# Patient Record
Sex: Female | Born: 1955
Health system: Southern US, Community
[De-identification: ages and names within clinical notes are randomized; demographics above are authoritative.]

## PROBLEM LIST (undated history)

## (undated) DIAGNOSIS — M810 Age-related osteoporosis without current pathological fracture: Secondary | ICD-10-CM

## (undated) DIAGNOSIS — Z950 Presence of cardiac pacemaker: Secondary | ICD-10-CM

## (undated) DIAGNOSIS — I442 Atrioventricular block, complete: Secondary | ICD-10-CM

## (undated) DIAGNOSIS — M818 Other osteoporosis without current pathological fracture: Secondary | ICD-10-CM

## (undated) DIAGNOSIS — M47812 Spondylosis without myelopathy or radiculopathy, cervical region: Secondary | ICD-10-CM

## (undated) DIAGNOSIS — D329 Benign neoplasm of meninges, unspecified: Secondary | ICD-10-CM

## (undated) HISTORY — DX: Other osteoporosis without current pathological fracture: M81.8

## (undated) HISTORY — DX: Presence of cardiac pacemaker: Z95.0

## (undated) HISTORY — DX: Benign neoplasm of meninges, unspecified: D32.9

## (undated) HISTORY — DX: Age-related osteoporosis without current pathological fracture: M81.0

## (undated) HISTORY — DX: Atrioventricular block, complete: I44.2

## (undated) HISTORY — DX: Spondylosis without myelopathy or radiculopathy, cervical region: M47.812

---

## 2014-01-29 ENCOUNTER — Other Ambulatory Visit: Payer: Self-pay | Admitting: Neurology

## 2014-01-29 ENCOUNTER — Encounter: Payer: Self-pay | Admitting: Neurology

## 2014-01-29 DIAGNOSIS — D329 Benign neoplasm of meninges, unspecified: Secondary | ICD-10-CM

## 2014-01-29 HISTORY — DX: Benign neoplasm of meninges, unspecified: D32.9

## 2014-01-29 NOTE — Progress Notes (Unsigned)
I called patient today, followup study for meningioma, last study done at [facility] in 2013, will reorder MRI the brain.

## 2014-02-13 ENCOUNTER — Other Ambulatory Visit: Payer: Self-pay

## 2014-02-26 ENCOUNTER — Telehealth: Payer: Self-pay | Admitting: Neurology

## 2014-02-26 ENCOUNTER — Ambulatory Visit
Admission: RE | Admit: 2014-02-26 | Discharge: 2014-02-26 | Disposition: A | Discharge status: Home / Self Care | Payer: BC Managed Care – PPO | Source: Ambulatory Visit | Attending: Neurology | Admitting: Neurology

## 2014-02-26 DIAGNOSIS — D329 Benign neoplasm of meninges, unspecified: Secondary | ICD-10-CM

## 2014-02-26 DIAGNOSIS — D320 Benign neoplasm of cerebral meninges: Secondary | ICD-10-CM

## 2014-02-26 MED ORDER — GADOBENATE DIMEGLUMINE 529 MG/ML IV SOLN
13.0000 mL | Freq: Once | INTRAVENOUS | Status: AC | PRN
  Administered 2014-02-26: 13 mL via INTRAVENOUS

## 2014-02-26 NOTE — Telephone Encounter (Signed)
I called patient, MRI the brain shows a meningioma, no change in size from December 2013. The line was busy, I will try back later.    MRI brain 02/26/2014:  Impression   Abnormal MRI brain (with and without) demonstrating: 1. There is a left fronto-parietal convexity meningioma (1.4x1.2cm on  axial views). No edema of significant mass effect. 2. No change from MRI on 08/09/12.

## 2014-02-26 NOTE — Telephone Encounter (Signed)
I called the patient again, discussed the MRI results with her. We will recheck this study in about 3 years.

## 2015-12-29 DIAGNOSIS — M79671 Pain in right foot: Secondary | ICD-10-CM | POA: Diagnosis not present

## 2015-12-29 DIAGNOSIS — M21621 Bunionette of right foot: Secondary | ICD-10-CM | POA: Diagnosis not present

## 2016-02-02 DIAGNOSIS — M21621 Bunionette of right foot: Secondary | ICD-10-CM | POA: Diagnosis not present

## 2016-02-19 DIAGNOSIS — H0014 Chalazion left upper eyelid: Secondary | ICD-10-CM | POA: Diagnosis not present

## 2016-03-03 DIAGNOSIS — Z23 Encounter for immunization: Secondary | ICD-10-CM | POA: Diagnosis not present

## 2016-03-03 DIAGNOSIS — Z1389 Encounter for screening for other disorder: Secondary | ICD-10-CM | POA: Diagnosis not present

## 2016-03-03 DIAGNOSIS — Z0000 Encounter for general adult medical examination without abnormal findings: Secondary | ICD-10-CM | POA: Diagnosis not present

## 2016-03-11 DIAGNOSIS — Z780 Asymptomatic menopausal state: Secondary | ICD-10-CM | POA: Diagnosis not present

## 2016-03-16 DIAGNOSIS — Z1231 Encounter for screening mammogram for malignant neoplasm of breast: Secondary | ICD-10-CM | POA: Diagnosis not present

## 2016-06-16 ENCOUNTER — Ambulatory Visit (INDEPENDENT_AMBULATORY_CARE_PROVIDER_SITE_OTHER): Payer: BLUE CROSS/BLUE SHIELD | Admitting: Neurology

## 2016-06-16 ENCOUNTER — Encounter: Payer: Self-pay | Admitting: Neurology

## 2016-06-16 VITALS — BP 104/68 | HR 58 | Ht 64.0 in | Wt 140.0 lb

## 2016-06-16 DIAGNOSIS — F0781 Postconcussional syndrome: Secondary | ICD-10-CM | POA: Diagnosis not present

## 2016-06-16 DIAGNOSIS — D329 Benign neoplasm of meninges, unspecified: Secondary | ICD-10-CM | POA: Diagnosis not present
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
## 2016-06-23 DIAGNOSIS — Z23 Encounter for immunization: Secondary | ICD-10-CM | POA: Diagnosis not present

## 2016-07-05 ENCOUNTER — Telehealth: Payer: Self-pay | Admitting: Neurology

## 2016-07-05 DIAGNOSIS — F0781 Postconcussional syndrome: Secondary | ICD-10-CM

## 2016-07-05 DIAGNOSIS — D329 Benign neoplasm of meninges, unspecified: Secondary | ICD-10-CM

## 2016-07-05 NOTE — Telephone Encounter (Signed)
Patient called regarding MRI scheduling, states she hasn't heard from anyone since seeing Dr. Jannifer Franklin October 26th.

## 2016-07-06 DIAGNOSIS — M1712 Unilateral primary osteoarthritis, left knee: Secondary | ICD-10-CM | POA: Diagnosis not present

## 2016-07-06 NOTE — Telephone Encounter (Signed)
Noted, thank you. I faxed the orders!

## 2016-07-06 NOTE — Telephone Encounter (Signed)
I got the authorization for her MRI, I called the patient back and set up her appointment at [facility] on Monday 07/11/16 at 3 pm. But since she is 63 they informed me that I need to fax a lab order that is BUNCreatine in along with the MRI order. If you don't mind putting that lab order in for me so I can fax them both together.

## 2016-07-11 DIAGNOSIS — D329 Benign neoplasm of meninges, unspecified: Secondary | ICD-10-CM | POA: Diagnosis not present

## 2016-07-11 DIAGNOSIS — R269 Unspecified abnormalities of gait and mobility: Secondary | ICD-10-CM | POA: Diagnosis not present

## 2016-07-11 DIAGNOSIS — F0781 Postconcussional syndrome: Secondary | ICD-10-CM | POA: Diagnosis not present

## 2016-07-12 ENCOUNTER — Telehealth: Payer: Self-pay | Admitting: Neurology

## 2016-07-12 NOTE — Telephone Encounter (Signed)
I called patient. The MRI of the brain showed a 1.5 cm enhancing mass consistent with meningioma along the left operculum. This appears to be stable from a prior study done in 2013. There is no associated cerebral edema. MRI of the brain itself is normal.  I discussed this with the patient.

## 2016-07-27 DIAGNOSIS — H1859 Other hereditary corneal dystrophies: Secondary | ICD-10-CM | POA: Diagnosis not present

## 2016-07-27 DIAGNOSIS — H52203 Unspecified astigmatism, bilateral: Secondary | ICD-10-CM | POA: Diagnosis not present

## 2017-03-17 ENCOUNTER — Encounter (HOSPITAL_COMMUNITY): Payer: Self-pay | Admitting: Emergency Medicine

## 2017-03-17 ENCOUNTER — Emergency Department (HOSPITAL_COMMUNITY): Payer: BLUE CROSS/BLUE SHIELD

## 2017-03-17 ENCOUNTER — Inpatient Hospital Stay (HOSPITAL_COMMUNITY)
Admission: EM | Admit: 2017-03-17 | Discharge: 2017-03-20 | DRG: 244 | Disposition: A | Discharge status: Home / Self Care | Payer: BLUE CROSS/BLUE SHIELD | Attending: Cardiology | Admitting: Cardiology

## 2017-03-17 DIAGNOSIS — Z950 Presence of cardiac pacemaker: Secondary | ICD-10-CM | POA: Diagnosis not present

## 2017-03-17 DIAGNOSIS — Z95810 Presence of automatic (implantable) cardiac defibrillator: Secondary | ICD-10-CM | POA: Diagnosis not present

## 2017-03-17 DIAGNOSIS — I442 Atrioventricular block, complete: Secondary | ICD-10-CM | POA: Diagnosis not present

## 2017-03-17 DIAGNOSIS — I499 Cardiac arrhythmia, unspecified: Secondary | ICD-10-CM | POA: Diagnosis not present

## 2017-03-17 DIAGNOSIS — I441 Atrioventricular block, second degree: Secondary | ICD-10-CM | POA: Diagnosis not present

## 2017-03-17 DIAGNOSIS — M810 Age-related osteoporosis without current pathological fracture: Secondary | ICD-10-CM | POA: Diagnosis not present

## 2017-03-17 DIAGNOSIS — Z880 Allergy status to penicillin: Secondary | ICD-10-CM

## 2017-03-17 DIAGNOSIS — Z86011 Personal history of benign neoplasm of the brain: Secondary | ICD-10-CM

## 2017-03-17 DIAGNOSIS — R001 Bradycardia, unspecified: Secondary | ICD-10-CM | POA: Diagnosis not present

## 2017-03-17 DIAGNOSIS — I4510 Unspecified right bundle-branch block: Secondary | ICD-10-CM | POA: Diagnosis present

## 2017-03-17 DIAGNOSIS — Z87820 Personal history of traumatic brain injury: Secondary | ICD-10-CM | POA: Diagnosis not present

## 2017-03-17 DIAGNOSIS — Z888 Allergy status to other drugs, medicaments and biological substances status: Secondary | ICD-10-CM | POA: Diagnosis not present

## 2017-03-17 DIAGNOSIS — Z959 Presence of cardiac and vascular implant and graft, unspecified: Secondary | ICD-10-CM

## 2017-03-17 DIAGNOSIS — I361 Nonrheumatic tricuspid (valve) insufficiency: Secondary | ICD-10-CM | POA: Diagnosis not present

## 2017-03-17 LAB — CBC WITH DIFFERENTIAL/PLATELET
BASOS ABS: 0 10*3/uL (ref 0.0–0.1)
BASOS PCT: 0 %
EOS ABS: 0.3 10*3/uL (ref 0.0–0.7)
EOS PCT: 3 %
HCT: 38.4 % (ref 36.0–46.0)
Hemoglobin: 13 g/dL (ref 12.0–15.0)
LYMPHS PCT: 42 %
Lymphs Abs: 4.1 10*3/uL — ABNORMAL HIGH (ref 0.7–4.0)
MCH: 29.7 pg (ref 26.0–34.0)
MCHC: 33.9 g/dL (ref 30.0–36.0)
MCV: 87.9 fL (ref 78.0–100.0)
MONO ABS: 0.4 10*3/uL (ref 0.1–1.0)
Monocytes Relative: 4 %
Neutro Abs: 4.9 10*3/uL (ref 1.7–7.7)
Neutrophils Relative %: 51 %
PLATELETS: 264 10*3/uL (ref 150–400)
RBC: 4.37 MIL/uL (ref 3.87–5.11)
RDW: 13.7 % (ref 11.5–15.5)
WBC: 9.7 10*3/uL (ref 4.0–10.5)

## 2017-03-17 LAB — I-STAT CHEM 8, ED
BUN: 18 mg/dL (ref 6–20)
CALCIUM ION: 1.31 mmol/L (ref 1.15–1.40)
Chloride: 107 mmol/L (ref 101–111)
Creatinine, Ser: 0.7 mg/dL (ref 0.44–1.00)
Glucose, Bld: 110 mg/dL — ABNORMAL HIGH (ref 65–99)
HCT: 37 % (ref 36.0–46.0)
HEMOGLOBIN: 12.6 g/dL (ref 12.0–15.0)
Potassium: 4 mmol/L (ref 3.5–5.1)
SODIUM: 142 mmol/L (ref 135–145)
TCO2: 26 mmol/L (ref 0–100)

## 2017-03-17 LAB — I-STAT TROPONIN, ED: TROPONIN I, POC: 0.01 ng/mL (ref 0.00–0.08)

## 2017-03-17 NOTE — ED Triage Notes (Addendum)
BIB Erika Reyes. EMS for bradycardia.  Mistakenly took 2 tabs of an energy supplement (Theacrine, L-Theanine, and caffeine from coffee) last night and was up all night. Took a nap today, about an hour, then woke up; got chest pressure and SOB.  Husband checked blood pressure and they noted her heart rate and blood pressure were low.  Insurance Nurse line advised pt to call 911.

## 2017-03-17 NOTE — ED Provider Notes (Signed)
Erika Reyes DEPT Provider Note   CSN: [id number] Arrival date & time: 03/17/17  2252 By signing my name below, I, Dyke Brackett, attest that this documentation has been prepared under the direction and in the presence of Damico Partin, Fredia Sorrow, MD . Electronically Signed: Dyke Brackett, Scribe. 03/17/2017. 11:36 PM.   History   Chief Complaint Chief Complaint  Patient presents with  . Bradycardia    HPI Erika Reyes is a 61 y.o. female brought in by ambulance who presents to the Emergency Department complaining of constant, severe bradycardia onset tonight at 9 pm. She reports associated fatigue dizziness, chest pressure and shortness of breath. Last night, pt mistakenly took 2 tabs of energy supplement EDGE-- active ingredients are Theacrine, L-Theanine, and caffeine-- and was unable to sleep last night. After waking up from her nap this evening, she began experiencing shortness of breath and chest pressure. Her husband checked her blood pressure which was 97/43 and pt's heart rate was 31. Other than Alondronate, the patient is currently on no regular medications. No recent travel to the [place]. No known recent tick bites.   The history is provided by the patient. No language interpreter was used.   Past Medical History:  Diagnosis Date  . Meningioma ([place]) 01/29/2014  . Osteoporosis of lumbar spine     Patient Active Problem List   Diagnosis Date Noted  . Post concussive encephalopathy 06/16/2016  . Meningioma ([place]) 01/29/2014    History reviewed. No pertinent surgical history.  OB History    No data available       Home Medications    Prior to Admission medications   Medication Sig Start Date End Date Taking? Authorizing Provider  alendronate (FOSAMAX) 35 MG tablet  06/09/16   [provider]  Multiple Vitamin (MULTI-VITAMINS) TABS Take by mouth.    [provider]  Omega 3-6-9 Fatty Acids (OMEGA-3-6-9 PO) Take 2 capsules by mouth daily.     [provider]    Family History Family History  Problem Relation Age of Onset  . Cancer Mother   . Dementia Mother   . Other Father        Cervical stenosis    Social History Social History  Substance Use Topics  . Smoking status: Never Smoker  . Smokeless tobacco: Never Used  . Alcohol use No     Allergies   Celecoxib and Penicillin g   Review of Systems Review of Systems  Constitutional: Positive for fatigue.  Respiratory: Positive for chest tightness and shortness of breath.   Cardiovascular:       +bradycardia  Skin: Negative for wound.  Neurological: Positive for dizziness.  All other systems reviewed and are negative.  Physical Exam Updated Vital Signs BP 132/60 (BP Location: Right Arm)   Pulse (!) 31   Temp 98.2 F (36.8 C) (Oral)   Resp 18   Ht 5\' 4"  (1.626 m)   Wt 140 lb (63.5 kg)   SpO2 98%   BMI 24.03 kg/m   Physical Exam  Constitutional: She is oriented to person, place, and time. She appears well-developed and well-nourished. No distress.  HENT:  Head: Normocephalic and atraumatic.  Eyes: Conjunctivae and EOM are normal.  Neck: Normal range of motion.  Cardiovascular: Normal rate, regular rhythm and normal heart sounds.   Pulmonary/Chest: Effort normal and breath sounds normal.  Abdominal: Soft. She exhibits no distension. There is no tenderness.  Musculoskeletal: Normal range of motion.  Neurological: She is alert and oriented to person,  place, and time.  Skin: Skin is warm and dry.  Psychiatric: She has a normal mood and affect. Judgment normal.  Nursing note and vitals reviewed.  ED Treatments / Results  DIAGNOSTIC STUDIES:  Oxygen Saturation is 98% on RA, normal by my interpretation.    COORDINATION OF CARE:  11:30 PM Will consult cardiology. Discussed treatment plan with pt at bedside and pt agreed to plan.   Labs (all labs ordered are listed, but only abnormal results are displayed) Labs Reviewed  CBC WITH  DIFFERENTIAL/PLATELET  COMPREHENSIVE METABOLIC PANEL  MAGNESIUM  BRAIN NATRIURETIC PEPTIDE  I-STAT CHEM 8, ED  I-STAT TROPONIN, ED    EKG  EKG Interpretation None       Radiology No results found.  Procedures Procedures (including critical care time)  Medications Ordered in ED Medications - No data to display   Initial Impression / Assessment and Plan / ED Course  I have reviewed the triage vital signs and the nursing notes.  Pertinent labs & imaging results that were available during my care of the patient were reviewed by me and considered in my medical decision making (see chart for details).    I personally performed the services described in this documentation, which was scribed in my presence. The recorded information has been reviewed and is accurate.   Patient is a 61 year old female presenting with low heart rate. Patient was feeling fatigued, dizzy today. Patient's husband took her vital signs at home found her to be bradycardic and called EMS.   Patient's only medication is Alondronate. She took 2 "natural caffeine pills" yesterday but she is worried because her symptoms.I think it likely unrelated.  She's not had any cardiac history. Had mild chest heaviness associated with the dizziness this evening. [place] sent (though no recent travel)   Patient is on pads. [facility] consult cardiology.  12:20 AM Discsused with cards. Will admit to ICU   CRITICAL CARE Performed by: Gardiner Sleeper Total critical care time: 60  minutes Critical care time was exclusive of separately billable procedures and treating other patients. Critical care was necessary to treat or prevent imminent or life-threatening deterioration. Critical care was time spent personally by me on the following activities: development of treatment plan with patient and/or surrogate as well as nursing, discussions with consultants, evaluation of patient's response to treatment, examination of patient,  obtaining history from patient or surrogate, ordering and performing treatments and interventions, ordering and review of laboratory studies, ordering and review of radiographic studies, pulse oximetry and re-evaluation of patient's condition.    Final Clinical Impressions(s) / ED Diagnoses   Final diagnoses:  None    New Prescriptions New Prescriptions   No medications on file     Macarthur Critchley, MD 03/18/17 [phone]

## 2017-03-18 ENCOUNTER — Inpatient Hospital Stay (HOSPITAL_COMMUNITY): Payer: BLUE CROSS/BLUE SHIELD

## 2017-03-18 ENCOUNTER — Encounter (HOSPITAL_COMMUNITY): Payer: Self-pay | Admitting: *Deleted

## 2017-03-18 DIAGNOSIS — Z87820 Personal history of traumatic brain injury: Secondary | ICD-10-CM | POA: Diagnosis not present

## 2017-03-18 DIAGNOSIS — I4510 Unspecified right bundle-branch block: Secondary | ICD-10-CM | POA: Diagnosis present

## 2017-03-18 DIAGNOSIS — I361 Nonrheumatic tricuspid (valve) insufficiency: Secondary | ICD-10-CM

## 2017-03-18 DIAGNOSIS — I441 Atrioventricular block, second degree: Principal | ICD-10-CM

## 2017-03-18 DIAGNOSIS — Z950 Presence of cardiac pacemaker: Secondary | ICD-10-CM | POA: Diagnosis not present

## 2017-03-18 DIAGNOSIS — I442 Atrioventricular block, complete: Secondary | ICD-10-CM | POA: Diagnosis not present

## 2017-03-18 DIAGNOSIS — I4430 Unspecified atrioventricular block: Secondary | ICD-10-CM

## 2017-03-18 DIAGNOSIS — M810 Age-related osteoporosis without current pathological fracture: Secondary | ICD-10-CM | POA: Diagnosis present

## 2017-03-18 DIAGNOSIS — Z880 Allergy status to penicillin: Secondary | ICD-10-CM | POA: Diagnosis not present

## 2017-03-18 DIAGNOSIS — Z86011 Personal history of benign neoplasm of the brain: Secondary | ICD-10-CM | POA: Diagnosis not present

## 2017-03-18 DIAGNOSIS — Z888 Allergy status to other drugs, medicaments and biological substances status: Secondary | ICD-10-CM | POA: Diagnosis not present

## 2017-03-18 LAB — ECHOCARDIOGRAM COMPLETE
AVLVOTPG: 8 mmHg
EERAT: 7.38
EWDT: 211 ms
FS: 36 % (ref 28–44)
Height: 64 in
IVS/LV PW RATIO, ED: 0.85
LA ID, A-P, ES: 39 mm
LA diam end sys: 39 mm
LA vol A4C: 52.6 ml
LA vol index: 32.6 mL/m2
LADIAMINDEX: 2.23 cm/m2
LAVOL: 57.1 mL
LV E/e' medial: 7.38
LV PW d: 8.89 mm — AB (ref 0.6–1.1)
LV e' LATERAL: 14.9 cm/s
LVEEAVG: 7.38
LVOT SV: 82 mL
LVOT VTI: 36 cm
LVOT area: 2.27 cm2
LVOT diameter: 17 mm
LVOT peak vel: 140 cm/s
MV Dec: 211
MV pk E vel: 110 m/s
MVPG: 5 mmHg
MVPKAVEL: 83.9 m/s
RV LATERAL S' VELOCITY: 14.3 cm/s
RV TAPSE: 26.4 mm
RV sys press: 39 mmHg
Reg peak vel: 247 cm/s
TDI e' lateral: 14.9
TDI e' medial: 12.9
TRMAXVEL: 247 cm/s
Weight: 2455.04 oz

## 2017-03-18 LAB — COMPREHENSIVE METABOLIC PANEL
ALBUMIN: 3.8 g/dL (ref 3.5–5.0)
ALT: 17 U/L (ref 14–54)
ANION GAP: 8 (ref 5–15)
AST: 31 U/L (ref 15–41)
Alkaline Phosphatase: 65 U/L (ref 38–126)
BUN: 13 mg/dL (ref 6–20)
CO2: 25 mmol/L (ref 22–32)
Calcium: 10 mg/dL (ref 8.9–10.3)
Chloride: 106 mmol/L (ref 101–111)
Creatinine, Ser: 0.74 mg/dL (ref 0.44–1.00)
GFR calc Af Amer: >60 mL/min (ref >60)
GFR calc non Af Amer: >60 mL/min (ref >60)
GLUCOSE: 110 mg/dL — AB (ref 65–99)
POTASSIUM: 3.9 mmol/L (ref 3.5–5.1)
SODIUM: 139 mmol/L (ref 135–145)
Total Bilirubin: 0.6 mg/dL (ref 0.3–1.2)
Total Protein: 6.4 g/dL — ABNORMAL LOW (ref 6.5–8.1)

## 2017-03-18 LAB — MAGNESIUM: Magnesium: 2.3 mg/dL (ref 1.7–2.4)

## 2017-03-18 LAB — BRAIN NATRIURETIC PEPTIDE: B Natriuretic Peptide: 314.2 pg/mL — ABNORMAL HIGH (ref 0.0–100.0)

## 2017-03-18 LAB — MRSA PCR SCREENING: MRSA by PCR: NEGATIVE

## 2017-03-18 MED ORDER — ENOXAPARIN SODIUM 40 MG/0.4ML ~~LOC~~ SOLN: 40.0000 mg | SUBCUTANEOUS | Status: DC

## 2017-03-18 MED ORDER — SODIUM CHLORIDE 0.9 % IV SOLN: 250.0000 mL | INTRAVENOUS | Status: DC | PRN

## 2017-03-18 MED ORDER — VANCOMYCIN HCL IN DEXTROSE 1-5 GM/200ML-% IV SOLN
1000.0000 mg | INTRAVENOUS | Status: AC
  Administered 2017-03-19: 1000 mg via INTRAVENOUS

## 2017-03-18 MED ORDER — ALENDRONATE SODIUM 35 MG PO TABS: 35.0000 mg | ORAL_TABLET | ORAL | Status: DC

## 2017-03-18 MED ORDER — ENOXAPARIN SODIUM 40 MG/0.4ML ~~LOC~~ SOLN
40.0000 mg | SUBCUTANEOUS | Status: DC
  Administered 2017-03-18: 40 mg via SUBCUTANEOUS
  Filled 2017-03-18: qty 0.4

## 2017-03-18 MED ORDER — SODIUM CHLORIDE 0.9 % IR SOLN
80.0000 mg | Status: AC
  Administered 2017-03-19: 80 mg
  Filled 2017-03-18: qty 2

## 2017-03-18 MED ORDER — CHLORHEXIDINE GLUCONATE 4 % EX LIQD
CUTANEOUS | Status: AC
  Administered 2017-03-18: 22:00:00
  Filled 2017-03-18: qty 15

## 2017-03-18 MED ORDER — YOU HAVE A PACEMAKER BOOK
Freq: Once | Status: AC
  Administered 2017-03-18: 21:00:00
  Filled 2017-03-18: qty 1

## 2017-03-18 MED ORDER — SODIUM CHLORIDE 0.9 % IV SOLN
INTRAVENOUS | Status: DC
  Administered 2017-03-19: 06:00:00 via INTRAVENOUS

## 2017-03-18 MED ORDER — SODIUM CHLORIDE 0.45 % IV SOLN
INTRAVENOUS | Status: DC
  Administered 2017-03-19: 06:00:00 via INTRAVENOUS

## 2017-03-18 MED ORDER — ATROPINE SULFATE 1 MG/10ML IJ SOSY
1.0000 mg | PREFILLED_SYRINGE | INTRAMUSCULAR | Status: DC | PRN
  Administered 2017-03-18: 1 mg via INTRAVENOUS
  Filled 2017-03-18 (×2): qty 10

## 2017-03-18 MED ORDER — EPINEPHRINE PF 1 MG/10ML IJ SOSY
PREFILLED_SYRINGE | INTRAMUSCULAR | Status: AC
  Filled 2017-03-18: qty 10

## 2017-03-18 NOTE — Progress Notes (Signed)
Patient is having more frequent drops in HR 23-24. Her BP 101/50s. She denies SOB, dizziness, N/V, skin is warm and dry. She complains of a "slight HA", otherwise she states she feels okay. Atropine given x1- HR up to 33 and BP 121/51. Dr. Emilio Aspen was notified via [facility] page. No response call at this time.

## 2017-03-18 NOTE — Progress Notes (Signed)
PHARMACIST - PHYSICIAN COMMUNICATION  CONCERNING: P&T Medication Policy Regarding Oral Bisphosphonates  RECOMMENDATION: Your order for alendronate (Fosamax), ibandronate (Boniva), or risedronate (Actonel) has been discontinued at this time.  If the patient's post-hospital medical condition warrants safe use of this class of drugs, please resume the pre-hospital regimen upon discharge.  DESCRIPTION:  Alendronate (Fosamax), ibandronate (Boniva), and risedronate (Actonel) can cause severe esophageal erosions in patients who are unable to remain upright at least 30 minutes after taking this medication.   Since brief interruptions in therapy are thought to have minimal impact on bone mineral density, the [place] has established that bisphosphonate orders should be routinely discontinued during hospitalization.   To override this safety policy and permit administration of Boniva, Fosamax, or Actonel in the hospital, prescribers must write "DO NOT HOLD" in the comments section when placing the order for this class of medications.   Wynona Neat, PharmD, BCPS 03/18/2017 1:13 AM

## 2017-03-18 NOTE — Progress Notes (Signed)
Dr. Emilio Aspen returned call. No new orders at this time. Will continue to monitor patient.
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
## 2017-03-18 NOTE — Consult Note (Signed)
ELECTROPHYSIOLOGY CONSULT NOTE    Primary Care Physician: Ronita Hipps, MD Referring Physician:  Dr Domenic Polite  Admit Date: 03/17/2017  Reason for consultation:  AV block  Erika Reyes is a 61 y.o. female with a h/o meningioma but otherwise very healthy who is now admitted with symptomatic AV block.  She reports being very active typically without limitation.  Yesterday, she noticed abrupt fatigue and SOB.  Her symptoms persisted.  She has had minor dizziness without syncope.  She has been found to have advanced AV block.  She is admitted to [facility]. Denies any recent medicine administrations.  Denies recent fevers, chills, or infections.  Today, she denies symptoms of palpitations, chest pain,   orthopnea, PND, lower extremity edema,   syncope, or neurologic sequela. The patient is tolerating medications without difficulties and is otherwise without complaint today.   Past Medical History:  Diagnosis Date  . Meningioma ([place]) 01/29/2014  . Osteoporosis of lumbar spine    History reviewed. No pertinent surgical history.  . enoxaparin (LOVENOX) injection  40 mg Subcutaneous Q24H   . sodium chloride      Allergies  Allergen Reactions  . Celecoxib Hives    Possible allergy, patient was also taking PCN at time reaction occurred.  Marland Kitchen Penicillin G Hives    Social History   Social History  . Marital status: Married    Spouse name: N/A  . Number of children: 2  . Years of education: 3 yrs college   Occupational History  . [employer]    Social History Main Topics  . Smoking status: Never Smoker  . Smokeless tobacco: Never Used  . Alcohol use No  . Drug use: No  . Sexual activity: Yes    Partners: Male    Birth control/ protection: None     Comment: Married   Other Topics Concern  . Not on file   Social History Narrative   Lives at home w/ her husband   Right-handed   Caffeine: rare   Owns a restaurant    Family History  Problem Relation Age of Onset  . Cancer  Mother   . Dementia Mother   . Other Father        Cervical stenosis    ROS- All systems are reviewed and negative except as per the HPI above  Physical Exam: Telemetry: Vitals:   03/18/17 0500 03/18/17 0530 03/18/17 0600 03/18/17 0800  BP: (!) 107/51 101/74 (!) 121/51   Pulse:      Resp: 10 10 15    Temp:    98.7 F (37.1 C)  TempSrc:    Oral  SpO2:      Weight:      Height:        GEN- The patient is well appearing, alert and oriented x 3 today.   Head- normocephalic, atraumatic Eyes-  Sclera clear, conjunctiva pink Ears- hearing intact Oropharynx- clear Neck- supple, no JVP Lymph- no cervical lymphadenopathy Lungs- Clear to ausculation bilaterally, normal work of breathing Heart- bradycardic regular rhythm, no murmurs, rubs or gallops, PMI not laterally displaced GI- soft, NT, ND, + BS Extremities- no clubbing, cyanosis, or edema MS- no significant deformity or atrophy Skin- no rash or lesion Psych- euthymic mood, full affect Neuro- strength and sensation are intact  EKG-  Sinus rhythm with complete heart block with RBBB  Labs:   Lab Results  Component Value Date   WBC 9.7 03/17/2017   HGB 12.6 03/17/2017   HCT 37.0 03/17/2017  MCV 87.9 03/17/2017   PLT 264 03/17/2017    Recent Labs Lab 03/17/17 2318 03/17/17 2342  NA 139 142  K 3.9 4.0  CL 106 107  CO2 25  --   BUN 13 18  CREATININE 0.74 0.70  CALCIUM 10.0  --   PROT 6.4*  --   BILITOT 0.6  --   ALKPHOS 65  --   ALT 17  --   AST 31  --   GLUCOSE 110* 110*     Radiology: no airspace disease  Echo:  pending  ASSESSMENT AND PLAN:   1. Mobitz II second degree AV block with transient complete heart block The patient is admitted with symptomatic AV block.  She is primarily 2:1 AV block but does have more advanced AV block at times.  No reversible causes have been found.  I would therefore recommend pacemaker implantation at this time.  Risks, benefits, alternatives to pacemaker implantation  were discussed in detail with the patient today. The patient understands that the risks include but are not limited to bleeding, infection, pneumothorax, perforation, tamponade, vascular damage, renal failure, MI, stroke, death,  and lead dislodgement and wishes to proceed. We will therefore schedule the procedure at the next available time. I will contact oncall supervisor to see if we can proceed with PPM this weekend.  Would keep Zoll pads on the patient in the interim. Echo tech will assist with echo today prior to PPM implant.  Pt is currently quite tenuous.  She is at risk for acute decompensation.  A high level of decision making was required for this encounter.  Thompson Grayer, MD 03/18/2017  11:20 AM
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
## 2017-03-18 NOTE — Progress Notes (Signed)
  Echocardiogram 2D Echocardiogram has been performed.  Erika Reyes 03/18/2017, 12:30 PM

## 2017-03-19 ENCOUNTER — Encounter (HOSPITAL_COMMUNITY)
Admission: EM | Disposition: A | Discharge status: Home / Self Care | Payer: Self-pay | Source: Home / Self Care | Attending: Cardiology

## 2017-03-19 HISTORY — PX: PACEMAKER IMPLANT: EP1218

## 2017-03-19 LAB — BASIC METABOLIC PANEL
ANION GAP: 5 (ref 5–15)
BUN: 18 mg/dL (ref 6–20)
CHLORIDE: 111 mmol/L (ref 101–111)
CO2: 25 mmol/L (ref 22–32)
Calcium: 9.6 mg/dL (ref 8.9–10.3)
Creatinine, Ser: 0.68 mg/dL (ref 0.44–1.00)
GFR calc Af Amer: >60 mL/min (ref >60)
GLUCOSE: 93 mg/dL (ref 65–99)
POTASSIUM: 4.1 mmol/L (ref 3.5–5.1)
Sodium: 141 mmol/L (ref 135–145)

## 2017-03-19 LAB — HIV ANTIBODY (ROUTINE TESTING W REFLEX): HIV Screen 4th Generation wRfx: NONREACTIVE

## 2017-03-19 LAB — SURGICAL PCR SCREEN
MRSA, PCR: NEGATIVE
Staphylococcus aureus: NEGATIVE

## 2017-03-19 LAB — FERRITIN: FERRITIN: 51 ng/mL (ref 11–307)

## 2017-03-19 SURGERY — PACEMAKER IMPLANT
Anesthesia: LOCAL

## 2017-03-19 MED ORDER — FENTANYL CITRATE (PF) 100 MCG/2ML IJ SOLN
INTRAMUSCULAR | Status: AC
  Filled 2017-03-19: qty 2

## 2017-03-19 MED ORDER — SODIUM CHLORIDE 0.9 % IR SOLN
Status: AC
  Filled 2017-03-19: qty 2

## 2017-03-19 MED ORDER — VANCOMYCIN HCL IN DEXTROSE 1-5 GM/200ML-% IV SOLN
1000.0000 mg | Freq: Two times a day (BID) | INTRAVENOUS | Status: AC
  Administered 2017-03-19: 1000 mg via INTRAVENOUS
  Filled 2017-03-19: qty 200

## 2017-03-19 MED ORDER — ONDANSETRON HCL 4 MG/2ML IJ SOLN
4.0000 mg | Freq: Four times a day (QID) | INTRAMUSCULAR | Status: DC | PRN
Start: 2017-03-19 — End: 2017-03-20

## 2017-03-19 MED ORDER — HYDROCODONE-ACETAMINOPHEN 5-325 MG PO TABS
1.0000 | ORAL_TABLET | ORAL | Status: DC | PRN
  Administered 2017-03-19: 2 via ORAL
  Administered 2017-03-19 – 2017-03-20 (×2): 1 via ORAL
  Filled 2017-03-19 (×2): qty 1
  Filled 2017-03-19: qty 2

## 2017-03-19 MED ORDER — HEPARIN (PORCINE) IN NACL 2-0.9 UNIT/ML-% IJ SOLN
INTRAMUSCULAR | Status: AC
  Filled 2017-03-19: qty 500

## 2017-03-19 MED ORDER — IOPAMIDOL (ISOVUE-370) INJECTION 76%
INTRAVENOUS | Status: DC | PRN
  Administered 2017-03-19: 15 mL via INTRAVENOUS

## 2017-03-19 MED ORDER — ACETAMINOPHEN 325 MG PO TABS: 325.0000 mg | ORAL_TABLET | ORAL | Status: DC | PRN

## 2017-03-19 MED ORDER — IOPAMIDOL (ISOVUE-370) INJECTION 76%
INTRAVENOUS | Status: AC
  Filled 2017-03-19: qty 50

## 2017-03-19 MED ORDER — SODIUM CHLORIDE 0.9% FLUSH
3.0000 mL | Freq: Two times a day (BID) | INTRAVENOUS | Status: DC
  Administered 2017-03-19: 3 mL via INTRAVENOUS

## 2017-03-19 MED ORDER — SODIUM CHLORIDE 0.9% FLUSH: 3.0000 mL | INTRAVENOUS | Status: DC | PRN

## 2017-03-19 MED ORDER — SODIUM CHLORIDE 0.9 % IV SOLN: 250.0000 mL | INTRAVENOUS | Status: DC | PRN

## 2017-03-19 MED ORDER — VANCOMYCIN HCL IN DEXTROSE 1-5 GM/200ML-% IV SOLN
INTRAVENOUS | Status: AC
  Filled 2017-03-19: qty 200

## 2017-03-19 MED ORDER — FENTANYL CITRATE (PF) 100 MCG/2ML IJ SOLN
INTRAMUSCULAR | Status: DC | PRN
  Administered 2017-03-19 (×2): 12.5 ug via INTRAVENOUS

## 2017-03-19 MED ORDER — HEPARIN (PORCINE) IN NACL 2-0.9 UNIT/ML-% IJ SOLN
INTRAMUSCULAR | Status: AC | PRN
  Administered 2017-03-19: 500 mL

## 2017-03-19 MED ORDER — LIDOCAINE HCL (PF) 1 % IJ SOLN
INTRAMUSCULAR | Status: DC | PRN
  Administered 2017-03-19: 45 mL

## 2017-03-19 MED ORDER — LIDOCAINE HCL (PF) 1 % IJ SOLN
INTRAMUSCULAR | Status: AC
  Filled 2017-03-19: qty 60

## 2017-03-19 SURGICAL SUPPLY — 12 items
CABLE SURGICAL S-101-97-12 (CABLE) ×4 IMPLANT
CATH RIGHTSITE C315HIS02 (CATHETERS) ×2 IMPLANT
IPG PACE AZUR XT DR MRI W1DR01 (Pacemaker) ×1 IMPLANT
LEAD CAPSURE NOVUS 5076-52CM (Lead) ×2 IMPLANT
LEAD SELECT SECURE 3830 383069 (Lead) ×1 IMPLANT
PACE AZURE XT DR MRI W1DR01 (Pacemaker) ×2 IMPLANT
PAD DEFIB LIFELINK (PAD) ×2 IMPLANT
SELECT SECURE 3830 383069 (Lead) ×2 IMPLANT
SHEATH CLASSIC 7F (SHEATH) ×4 IMPLANT
SLITTER 6232ADJ (MISCELLANEOUS) ×2 IMPLANT
TRAY PACEMAKER INSERTION (PACKS) ×2 IMPLANT
WIRE HI TORQ VERSACORE-J 145CM (WIRE) ×2 IMPLANT

## 2017-03-19 NOTE — Progress Notes (Signed)
Electrophysiology Rounding Note  Patient Name: Erika Reyes Date of Encounter: 03/19/2017    Subjective   The patient is doing well today.  At this time, the patient denies chest pain, shortness of breath, or any new concerns.  AV block persists.  Inpatient Medications    Scheduled Meds: . gentamicin irrigation  80 mg Irrigation To Cath   Continuous Infusions: . sodium chloride 10 mL/hr at 03/19/17 0536  . sodium chloride    . sodium chloride 10 mL/hr at 03/19/17 0536  . vancomycin     PRN Meds: sodium chloride, atropine   Vital Signs    Vitals:   03/19/17 0500 03/19/17 0600 03/19/17 0700 03/19/17 0753  BP: (!) 117/49 (!) 119/55    Pulse: (!) 37 (!) 33 (!) 31   Resp: 13 14 11    Temp:    97.7 F (36.5 C)  TempSrc:    Oral  SpO2: 95% 97% 97%   Weight: 147 lb 4.8 oz (66.8 kg)     Height:        Intake/Output Summary (Last 24 hours) at 03/19/17 0844 Last data filed at 03/19/17 0700  Gross per 24 hour  Intake              628 ml  Output                0 ml  Net              628 ml   Filed Weights   03/17/17 2307 03/18/17 0345 03/19/17 0500  Weight: 140 lb (63.5 kg) 153 lb 7 oz (69.6 kg) 147 lb 4.8 oz (66.8 kg)    Physical Exam    GEN- The patient is well appearing, alert and oriented x 3 today.   Head- normocephalic, atraumatic Eyes-  Sclera clear, conjunctiva pink Ears- hearing intact Oropharynx- clear Neck- supple Lungs- Clear to ausculation bilaterally, normal work of breathing Heart- bradycardic rhythm GI- soft, NT, ND, + BS Extremities- no clubbing, cyanosis, or edema Skin- no rash or lesion Psych- euthymic mood, full affect Neuro- strength and sensation are intact  Labs    CBC  Recent Labs  03/17/17 2318 03/17/17 2342  WBC 9.7  --   NEUTROABS 4.9  --   HGB 13.0 12.6  HCT 38.4 37.0  MCV 87.9  --   PLT 264  --    Basic Metabolic Panel  Recent Labs  03/17/17 2318 03/17/17 2342 03/19/17 0255  NA 139 142 141  K 3.9 4.0 4.1  CL  106 107 111  CO2 25  --  25  GLUCOSE 110* 110* 93  BUN 13 18 18   CREATININE 0.74 0.70 0.68  CALCIUM 10.0  --  9.6  MG 2.3  --   --    Liver Function Tests  Recent Labs  03/17/17 2318  AST 31  ALT 17  ALKPHOS 65  BILITOT 0.6  PROT 6.4*  ALBUMIN 3.8    Telemetry    Sinus rhythm with 2:1 block and occasional complete heart block (personally reviewed)  Radiology    Dg Chest Portable 1 View  Result Date: 03/17/2017 CLINICAL DATA:  Bradycardia and low blood pressure EXAM: PORTABLE CHEST 1 VIEW COMPARISON:  None. FINDINGS: The heart size and mediastinal contours are within normal limits. Both lungs are clear. The visualized skeletal structures are unremarkable. IMPRESSION: No active disease. Electronically Signed   By: Donavan Foil M.D.   On: 03/17/2017 23:53   Echo is reviewed,  EF is preserved   Patient Profile     Erika Reyes is a 61 y.o. female.  She was admitted for mobitz II second degree AV block.   Assessment & Plan    1. Mobitz II second degree AV block persists I would therefore recommend pacemaker implantation at this time.  Risks, benefits, alternatives to pacemaker implantation were discussed again in detail with the patient and her husband this am. The patient understands that the risks include but are not limited to bleeding, infection, pneumothorax, perforation, tamponade, vascular damage, renal failure, MI, stroke, death,  and lead dislodgement and wishes to proceed at this time.   Signed, Thompson Grayer, MD  03/19/2017, 8:44 AM

## 2017-03-19 NOTE — Interval H&P Note (Signed)
History and Physical Interval Note:  03/19/2017 8:47 AM  Erika Reyes  has presented today for surgery, with the diagnosis of syncope  The various methods of treatment have been discussed with the patient and family. After consideration of risks, benefits and other options for treatment, the patient has consented to  Procedure(s): Pacemaker Implant (N/A) as a surgical intervention .  The patient's history has been reviewed, patient examined, no change in status, stable for surgery.  I have reviewed the patient's chart and labs.  Questions were answered to the patient's satisfaction.     Thompson Grayer

## 2017-03-19 NOTE — Progress Notes (Signed)
Hibiclens bath given last night and this morning as well as CHG wipe down this morning after bath. Patient and her husband viewed pacemaker video and booklet, questions answered.

## 2017-03-19 NOTE — H&P (View-Only) (Signed)
Electrophysiology Rounding Note  Patient Name: Erika Reyes Date of Encounter: 03/19/2017    Subjective   The patient is doing well today.  At this time, the patient denies chest pain, shortness of breath, or any new concerns.  AV block persists.  Inpatient Medications    Scheduled Meds: . gentamicin irrigation  80 mg Irrigation To Cath   Continuous Infusions: . sodium chloride 10 mL/hr at 03/19/17 0536  . sodium chloride    . sodium chloride 10 mL/hr at 03/19/17 0536  . vancomycin     PRN Meds: sodium chloride, atropine   Vital Signs    Vitals:   03/19/17 0500 03/19/17 0600 03/19/17 0700 03/19/17 0753  BP: (!) 117/49 (!) 119/55    Pulse: (!) 37 (!) 33 (!) 31   Resp: 13 14 11    Temp:    97.7 F (36.5 C)  TempSrc:    Oral  SpO2: 95% 97% 97%   Weight: 147 lb 4.8 oz (66.8 kg)     Height:        Intake/Output Summary (Last 24 hours) at 03/19/17 0844 Last data filed at 03/19/17 0700  Gross per 24 hour  Intake              628 ml  Output                0 ml  Net              628 ml   Filed Weights   03/17/17 2307 03/18/17 0345 03/19/17 0500  Weight: 140 lb (63.5 kg) 153 lb 7 oz (69.6 kg) 147 lb 4.8 oz (66.8 kg)    Physical Exam    GEN- The patient is well appearing, alert and oriented x 3 today.   Head- normocephalic, atraumatic Eyes-  Sclera clear, conjunctiva pink Ears- hearing intact Oropharynx- clear Neck- supple Lungs- Clear to ausculation bilaterally, normal work of breathing Heart- bradycardic rhythm GI- soft, NT, ND, + BS Extremities- no clubbing, cyanosis, or edema Skin- no rash or lesion Psych- euthymic mood, full affect Neuro- strength and sensation are intact  Labs    CBC  Recent Labs  03/17/17 2318 03/17/17 2342  WBC 9.7  --   NEUTROABS 4.9  --   HGB 13.0 12.6  HCT 38.4 37.0  MCV 87.9  --   PLT 264  --    Basic Metabolic Panel  Recent Labs  03/17/17 2318 03/17/17 2342 03/19/17 0255  NA 139 142 141  K 3.9 4.0 4.1  CL  106 107 111  CO2 25  --  25  GLUCOSE 110* 110* 93  BUN 13 18 18   CREATININE 0.74 0.70 0.68  CALCIUM 10.0  --  9.6  MG 2.3  --   --    Liver Function Tests  Recent Labs  03/17/17 2318  AST 31  ALT 17  ALKPHOS 65  BILITOT 0.6  PROT 6.4*  ALBUMIN 3.8    Telemetry    Sinus rhythm with 2:1 block and occasional complete heart block (personally reviewed)  Radiology    Dg Chest Portable 1 View  Result Date: 03/17/2017 CLINICAL DATA:  Bradycardia and low blood pressure EXAM: PORTABLE CHEST 1 VIEW COMPARISON:  None. FINDINGS: The heart size and mediastinal contours are within normal limits. Both lungs are clear. The visualized skeletal structures are unremarkable. IMPRESSION: No active disease. Electronically Signed   By: Donavan Foil M.D.   On: 03/17/2017 23:53   Echo is reviewed,  EF is preserved   Patient Profile     Erika Reyes is a 61 y.o. female.  She was admitted for mobitz II second degree AV block.   Assessment & Plan    1. Mobitz II second degree AV block persists I would therefore recommend pacemaker implantation at this time.  Risks, benefits, alternatives to pacemaker implantation were discussed again in detail with the patient and her husband this am. The patient understands that the risks include but are not limited to bleeding, infection, pneumothorax, perforation, tamponade, vascular damage, renal failure, MI, stroke, death,  and lead dislodgement and wishes to proceed at this time.   Signed, Thompson Grayer, MD  03/19/2017, 8:44 AM

## 2017-03-20 ENCOUNTER — Inpatient Hospital Stay (HOSPITAL_COMMUNITY): Payer: BLUE CROSS/BLUE SHIELD

## 2017-03-20 ENCOUNTER — Encounter (HOSPITAL_COMMUNITY): Payer: Self-pay | Admitting: Internal Medicine

## 2017-03-20 LAB — BASIC METABOLIC PANEL
ANION GAP: 5 (ref 5–15)
BUN: 11 mg/dL (ref 6–20)
CO2: 29 mmol/L (ref 22–32)
Calcium: 9.4 mg/dL (ref 8.9–10.3)
Chloride: 107 mmol/L (ref 101–111)
Creatinine, Ser: 0.65 mg/dL (ref 0.44–1.00)
GLUCOSE: 92 mg/dL (ref 65–99)
POTASSIUM: 3.6 mmol/L (ref 3.5–5.1)
Sodium: 141 mmol/L (ref 135–145)

## 2017-03-20 LAB — KAPPA/LAMBDA LIGHT CHAINS
KAPPA FREE LGHT CHN: 10.9 mg/L (ref 3.3–19.4)
Kappa, lambda light chain ratio: 1.17 (ref 0.26–1.65)
LAMDA FREE LIGHT CHAINS: 9.3 mg/L (ref 5.7–26.3)

## 2017-03-20 LAB — B. BURGDORFI ANTIBODIES: B burgdorferi Ab IgG+IgM: <0.91 {ISR} (ref 0.00–0.90)

## 2017-03-20 NOTE — Progress Notes (Signed)
Pt being discharged to home with husband. Cardiology educated patient on pacemaker and associated iPhone application, pt verbalizes understanding. Pt verbalizes understanding of discharge instructions.

## 2017-03-20 NOTE — Research (Signed)
[facility] FIELD EVALUATION Informed Consent   Subject Name: Erika Reyes  Subject met inclusion and exclusion criteria.  The informed consent form, evaluation requirements and expectations were reviewed with the subject and questions and concerns were addressed prior to the signing of the consent form.  The subject verbalized understanding of the trail requirements.  The subject agreed to participate in the [facility] evaluation and signed the informed consent.  The informed consent was obtained prior to performance of any protocol-specific procedures for the subject.  A copy of the signed informed consent was given to the subject and a copy was placed in the subject's medical record.  [name] 03/20/2017, 8:28 AM

## 2017-03-20 NOTE — Progress Notes (Signed)
Patient returned from x-ray, was pale and stated she felt faintish as she got down to xray, yet states she is feeling better now. B/P stable, continue to monitor closely.

## 2017-03-20 NOTE — Research (Signed)
The Bluesync app was installed on patient phone. Training was completed and pacemaker paired. Patient and patients husband verbalized understanding.

## 2017-03-20 NOTE — Progress Notes (Signed)
Doing very well s/p PPM implantation. CXR reveals stable leads Device interrogation is reviewed and normal.  DC to home Routine wound care and follow-up  Thompson Grayer MD, [facility] 03/20/2017 8:00 AM

## 2017-03-20 NOTE — Discharge Summary (Signed)
ELECTROPHYSIOLOGY PROCEDURE DISCHARGE SUMMARY    Patient ID: Erika Reyes,  MRN: [id number], DOB/AGE: 01/10/1956 61 y.o.  Admit date: 03/17/2017 Discharge date: 03/20/2017  Primary Care Physician: Erika Hipps, MD  Primary Cardiologist: New to [facility], Dr. Domenic Reyes Electrophysiologist: New to Dr. Rayann Reyes  Primary Discharge Diagnosis:  1. Symptomatic bradycardia     High degree AVblock  Secondary Discharge Diagnosis:  1. Meningioma 2. Hx of concussion (3 years ago)     Remains with chronic/intermittent motion/balance instability  Allergies  Allergen Reactions  . Celecoxib Hives    Possible allergy, patient was also taking PCN at time reaction occurred.  Marland Kitchen Penicillin G Hives     Procedures This Admission:  1.  Implantation of a MDT dual chamber PPM on 03/19/17 by Dr Erika Reyes.  The patient received a Medtronic Azure XT DR MRI conditional ScureScan model [id number] (serial number [id number]) pacemaker, Medtronic model [id number] (serial number [id number] right atrial lead and a Medtronic model 3830-69 (serial number [id number]) right ventricular lead  There were no immediate post procedure complications. 2.  CXR on 03/20/17 demonstrated no pneumothorax status post device implantation.   Brief HPI: Erika Reyes is a 61 y.o. female was admitted to [facility] after acute onset of unusual lightheadedness, SOB, found with high degree AVBlock, V rates 30's without reversible causes.  Past medical history includes only meningioma, and a concussion 3 years ago that since then has trouble with intermittent balance/motion instability.    Hospital Course:  The patient was admitted and observed in ICD, EP was consulted and underwent implantation of a PPM with details as outlined above.  She was monitored on telemetry overnight which demonstrated SR, V paced rhythm.  Left chest was without hematoma or ecchymosis.  The device was interrogated this morning and found to be functioning normally.  CXR was  obtained and demonstrated no pneumothorax status post device implantation.  Wound care, arm mobility, and restrictions were reviewed with the patient.  The patient upon getting up and transported to radiology became dizzy/lightheaded that she attributed to her (post concussion syndrome) that resolved once back in bed, she again became lightheaded when initially OOB again, though feeling much better and without any symptoms after eating breakfast, she has ambulated and feeling well. The patient was examined by Dr. Rayann Reyes and considered stable for discharge to home.    Physical Exam: Vitals:   03/20/17 0300 03/20/17 0400 03/20/17 0500 03/20/17 0736  BP: 125/62 (!) 112/52  115/67  Pulse: 65   69  Resp: 17 10 10 13   Temp: 98.2 F (36.8 C)   97.8 F (36.6 C)  TempSrc: Oral   Oral  SpO2: 97%     Weight:      Height:        GEN- The patient is well appearing, alert and oriented x 3 today.   HEENT: normocephalic, atraumatic; sclera clear, conjunctiva pink; hearing intact; oropharynx clear; neck supple, no JVP Lungs- CTA b/l, normal work of breathing.  No wheezes, rales, rhonchi Heart- RRR, no murmurs, rubs or gallops, PMI not laterally displaced GI- soft, non-tender, non-distended Extremities- no clubbing, cyanosis, or edema MS- no significant deformity or atrophy Skin- warm and dry, no rash or lesion, left chest without hematoma/ecchymosis Psych- euthymic mood, full affect Neuro- no gross deficits   Labs:   Lab Results  Component Value Date   WBC 9.7 03/17/2017   HGB 12.6 03/17/2017   HCT 37.0 03/17/2017   MCV 87.9 03/17/2017  PLT 264 03/17/2017    Recent Labs Lab 03/17/17 2318  03/20/17 0332  NA 139  < > 141  K 3.9  < > 3.6  CL 106  < > 107  CO2 25  < > 29  BUN 13  < > 11  CREATININE 0.74  < > 0.65  CALCIUM 10.0  < > 9.4  PROT 6.4*  --   --   BILITOT 0.6  --   --   ALKPHOS 65  --   --   ALT 17  --   --   AST 31  --   --   GLUCOSE 110*  < > 92  < > = values in this  interval not displayed.  Discharge Medications:  Allergies as of 03/20/2017      Reactions   Celecoxib Hives   Possible allergy, patient was also taking PCN at time reaction occurred.   Penicillin G Hives      Medication List    TAKE these medications   alendronate 35 MG tablet Commonly known as:  FOSAMAX Take 35 mg by mouth every 7 (seven) days.       Disposition:  Home Discharge Instructions    Diet - low sodium heart healthy    Complete by:  As directed    Increase activity slowly    Complete by:  As directed      Follow-up Information    [facility] Follow up on 03/31/2017.   Specialty:  Cardiology Why:  12:00PM (noon), wound check visit Contact information: [address] [place]       [place] Follow up on 05/01/2017.   Specialty:  Cardiology Why:  pacemaker nurse visit Contact information: [address], [place] [phone]       Erika Grayer, MD Follow up on 06/19/2017.   Specialty:  Cardiology Why:  9:15AM Contact information: [place] [address] [address] [phone]           Duration of Discharge Encounter: Greater than 30 minutes including physician time.  Erika Night, PA-C 03/20/2017 9:12 AM

## 2017-03-20 NOTE — Discharge Instructions (Signed)
° ° °  Supplemental Discharge Instructions for  Pacemaker/Defibrillator Patients  Activity No heavy lifting or vigorous activity with your left/right arm for 6 to 8 weeks.  Do not raise your left/right arm above your head for one week.  Gradually raise your affected arm as drawn below.             03/23/17                       03/24/17                      03/25/17                     03/26/17 __  NO DRIVING for until cleared to at your wound check visit.  WOUND CARE - Keep the wound area clean and dry.  Do not get this area wet, no showers until cleared at your wound check visit - The tape/steri-strips on your wound will fall off; do not pull them off.  No bandage is needed on the site.  DO  NOT apply any creams, oils, or ointments to the wound area. - If you notice any drainage or discharge from the wound, any swelling or bruising at the site, or you develop a fever > 101? F after you are discharged home, call the office at once.  Special Instructions - You are still able to use cellular telephones; use the ear opposite the side where you have your pacemaker/defibrillator.  Avoid carrying your cellular phone near your device. - When traveling through airports, show security personnel your identification card to avoid being screened in the metal detectors.  Ask the security personnel to use the hand wand. - Avoid arc welding equipment, MRI testing (magnetic resonance imaging), TENS units (transcutaneous nerve stimulators).  Call the office for questions about other devices. - Avoid electrical appliances that are in poor condition or are not properly grounded. - Microwave ovens are safe to be near or to operate.  Additional information for defibrillator patients should your device go off: - If your device goes off ONCE and you feel fine afterward, notify the device clinic nurses. - If your device goes off ONCE and you do not feel well afterward, call 911. - If your device goes off TWICE, call  911. - If your device goes off THREE times in one day, call 911.  DO NOT DRIVE YOURSELF OR A FAMILY MEMBER WITH A DEFIBRILLATOR TO THE HOSPITAL--CALL 911.

## 2017-03-21 LAB — PROTEIN ELECTROPHORESIS, SERUM
A/G RATIO SPE: 1.3 (ref 0.7–1.7)
Albumin ELP: 3.1 g/dL (ref 2.9–4.4)
Alpha-1-Globulin: 0.2 g/dL (ref 0.0–0.4)
Alpha-2-Globulin: 0.5 g/dL (ref 0.4–1.0)
BETA GLOBULIN: 0.8 g/dL (ref 0.7–1.3)
GLOBULIN, TOTAL: 2.4 g/dL (ref 2.2–3.9)
Gamma Globulin: 0.9 g/dL (ref 0.4–1.8)
TOTAL PROTEIN ELP: 5.5 g/dL — AB (ref 6.0–8.5)

## 2017-04-03 ENCOUNTER — Ambulatory Visit (INDEPENDENT_AMBULATORY_CARE_PROVIDER_SITE_OTHER): Payer: Self-pay | Admitting: *Deleted

## 2017-04-03 ENCOUNTER — Ambulatory Visit (INDEPENDENT_AMBULATORY_CARE_PROVIDER_SITE_OTHER): Payer: BLUE CROSS/BLUE SHIELD | Admitting: *Deleted

## 2017-04-03 DIAGNOSIS — I442 Atrioventricular block, complete: Secondary | ICD-10-CM | POA: Diagnosis not present

## 2017-04-04 LAB — CUP PACEART REMOTE DEVICE CHECK
Date Time Interrogation Session: 20180814114059
Implantable Lead Location: 753860
Implantable Lead Model: 3830
Implantable Lead Model: 5076
Implantable Pulse Generator Implant Date: 20180729
MDC IDC LEAD IMPLANT DT: 20180729
MDC IDC LEAD IMPLANT DT: 20180729
MDC IDC LEAD LOCATION: 753859

## 2017-04-04 NOTE — Progress Notes (Signed)
Remote pacemaker check for BluSync trial- no charge.

## 2017-04-07 LAB — CUP PACEART INCLINIC DEVICE CHECK
Battery Remaining Longevity: 54 mo
Battery Voltage: 3.17 V
Brady Statistic RA Percent Paced: 13.15 %
Brady Statistic RV Percent Paced: 99.99 %
Date Time Interrogation Session: 20180813163313
Implantable Lead Implant Date: 20180729
Implantable Lead Implant Date: 20180729
Implantable Lead Location: 753859
Implantable Lead Location: 753860
Implantable Lead Model: 3830
Implantable Pulse Generator Implant Date: 20180729
Lead Channel Impedance Value: 608 Ohm
Lead Channel Pacing Threshold Amplitude: 0.5 V
Lead Channel Pacing Threshold Pulse Width: 0.4 ms
Lead Channel Sensing Intrinsic Amplitude: 1.25 mV
Lead Channel Setting Pacing Amplitude: 5 V
Lead Channel Setting Sensing Sensitivity: 0.9 mV
MDC IDC MSMT LEADCHNL RA IMPEDANCE VALUE: 342 Ohm
MDC IDC MSMT LEADCHNL RA IMPEDANCE VALUE: 418 Ohm
MDC IDC MSMT LEADCHNL RA SENSING INTR AMPL: 2 mV
MDC IDC MSMT LEADCHNL RV IMPEDANCE VALUE: 266 Ohm
MDC IDC MSMT LEADCHNL RV PACING THRESHOLD AMPLITUDE: 1.25 V
MDC IDC MSMT LEADCHNL RV PACING THRESHOLD PULSEWIDTH: 1 ms
MDC IDC MSMT LEADCHNL RV SENSING INTR AMPL: 2.625 mV
MDC IDC MSMT LEADCHNL RV SENSING INTR AMPL: 2.625 mV
MDC IDC SET LEADCHNL RA PACING AMPLITUDE: 3.5 V
MDC IDC SET LEADCHNL RV PACING PULSEWIDTH: 1 ms
MDC IDC STAT BRADY AP VP PERCENT: 13.05 %
MDC IDC STAT BRADY AP VS PERCENT: 0.01 %
MDC IDC STAT BRADY AS VP PERCENT: 86.94 %
MDC IDC STAT BRADY AS VS PERCENT: 0 %

## 2017-04-07 NOTE — Progress Notes (Signed)
Wound check appointment. Steri-strips removed. Wound without redness or edema. Incision edges approximated, wound well healed. Normal device function. Thresholds, sensing, and impedances consistent with implant measurements. HBP test performed with use of rhythm stip, non selective capture noted >3V. Device programmed at 3.5V(RA) and 5V(RV) for extra safety margin until 3 month visit. Histogram distribution appropriate for patient and level of activity. No mode switches or high ventricular rates noted. Patient educated about wound care, arm mobility, lifting restrictions. ROV in 6 weeks with AS, and 3 months with JA.

## 2017-04-11 ENCOUNTER — Telehealth: Payer: Self-pay | Admitting: Cardiology

## 2017-04-11 NOTE — Telephone Encounter (Signed)
Patient husband called and stated that patient had to help son lift something that she should not have helped with. Patient is experiencing pain at the device site. Instructed pt husband to send a remote transmission and once the transmission is received a Chief Operating Officer will review and call him back. Pt husband verbalized understanding.

## 2017-04-11 NOTE — Telephone Encounter (Signed)
Transmission received. Normal device function. Made [name] aware to have [name] rest her arm and call back if they notice any redness, swelling, drainage from device site. He verbalizes understanding and is appreciative.

## 2017-04-13 ENCOUNTER — Encounter: Payer: Self-pay | Admitting: Cardiology

## 2017-04-17 ENCOUNTER — Ambulatory Visit (INDEPENDENT_AMBULATORY_CARE_PROVIDER_SITE_OTHER): Payer: Self-pay | Admitting: *Deleted

## 2017-04-17 DIAGNOSIS — I442 Atrioventricular block, complete: Secondary | ICD-10-CM

## 2017-04-18 NOTE — Progress Notes (Signed)
Remote pacemaker transmission.   

## 2017-04-28 ENCOUNTER — Encounter: Payer: Self-pay | Admitting: Cardiology

## 2017-05-03 ENCOUNTER — Encounter: Payer: BLUE CROSS/BLUE SHIELD | Admitting: Nurse Practitioner

## 2017-05-04 ENCOUNTER — Telehealth: Payer: Self-pay | Admitting: Internal Medicine

## 2017-05-04 NOTE — Telephone Encounter (Signed)
New message     Pt c/o Shortness Of Breath: STAT if SOB developed within the last 24 hours or pt is noticeably SOB on the phone  1. Are you currently SOB (can you hear that pt is SOB on the phone)? no  2. How long have you been experiencing SOB?  3 weeks   3. Are you SOB when sitting or when up moving around?moving around   4. Are you currently experiencing any other symptoms?  Labored breathing trying to do anything, this stared right after she had her surgery

## 2017-05-05 ENCOUNTER — Encounter: Payer: BLUE CROSS/BLUE SHIELD | Admitting: Nurse Practitioner

## 2017-05-05 LAB — CUP PACEART REMOTE DEVICE CHECK
Battery Remaining Longevity: 51 mo
Battery Voltage: 3.15 V
Brady Statistic RA Percent Paced: 33.55 %
Implantable Lead Implant Date: 20180729
Implantable Lead Location: 753859
Implantable Lead Location: 753860
Implantable Lead Model: 3830
Lead Channel Pacing Threshold Pulse Width: 0.4 ms
Lead Channel Sensing Intrinsic Amplitude: 1.125 mV
Lead Channel Sensing Intrinsic Amplitude: 1.125 mV
Lead Channel Setting Pacing Pulse Width: 1 ms
MDC IDC LEAD IMPLANT DT: 20180729
MDC IDC MSMT LEADCHNL RA IMPEDANCE VALUE: 361 Ohm
MDC IDC MSMT LEADCHNL RA IMPEDANCE VALUE: 475 Ohm
MDC IDC MSMT LEADCHNL RA PACING THRESHOLD AMPLITUDE: 0.5 V
MDC IDC MSMT LEADCHNL RV IMPEDANCE VALUE: 285 Ohm
MDC IDC MSMT LEADCHNL RV IMPEDANCE VALUE: 494 Ohm
MDC IDC MSMT LEADCHNL RV PACING THRESHOLD AMPLITUDE: 2.25 V
MDC IDC MSMT LEADCHNL RV PACING THRESHOLD PULSEWIDTH: 0.4 ms
MDC IDC MSMT LEADCHNL RV SENSING INTR AMPL: 4 mV
MDC IDC MSMT LEADCHNL RV SENSING INTR AMPL: 4 mV
MDC IDC PG IMPLANT DT: 20180729
MDC IDC SESS DTM: 20180828130450
MDC IDC SET LEADCHNL RA PACING AMPLITUDE: 3.5 V
MDC IDC SET LEADCHNL RV PACING AMPLITUDE: 5 V
MDC IDC SET LEADCHNL RV SENSING SENSITIVITY: 0.9 mV
MDC IDC STAT BRADY AP VP PERCENT: 32.62 %
MDC IDC STAT BRADY AP VS PERCENT: 0.95 %
MDC IDC STAT BRADY AS VP PERCENT: 49.13 %
MDC IDC STAT BRADY AS VS PERCENT: 17.29 %
MDC IDC STAT BRADY RV PERCENT PACED: 81.75 %

## 2017-05-05 NOTE — Telephone Encounter (Signed)
lmtcb

## 2017-05-07 NOTE — Progress Notes (Signed)
Cardiology Office Note Date:  05/10/2017  Patient ID:  Erika Reyes, Erika Reyes Jun 30, 1956, MRN [id number] PCP:  Ronita Hipps, MD  Cardiologist:  Dr. Domenic Polite Electrophysiologist: Dr. Rayann Heman   Chief Complaint:  DOE  History of Present Illness: Mescal Flinchbaugh is a 61 y.o. female with history of meningioma, post concussion sx with intermittent chronic motion/balance instability, more recently 03/17/17 admitted with acute onset fatigue, DOE found in high degree AVBlock with 2:1 as well as CHB noted and underwent PPM implant 03/19/17.   She comes in today to be seen for Dr. Rayann Heman, called in noting c/o SOB with any exertion, reportedly since her PPM implant. Initially post implant felt quite well, though she says wasn't really doing much of anything, in the last couple weeks though has started to do more and feels like she is getting winded easily, and unusually.  Prior to her hospital stay she denied exertional intolerances, and her SOB was mostly noted at rest and episodic with sudden feeling like she was SOB (this remains resolved).  She is not having any kind of CP, site healed up well, no rest SOB, no symptoms of PND or orthopnea.  She has some degree of baseline dizziness/gait issue and hx of syncope since her concussion, nothing ot of her baseline is noted.   Device information: MDT dual chamber PPM, 03/19/17, RV lead in HIS position, Dr. Rayann Heman (BluSync)  Past Medical History:  Diagnosis Date  . Meningioma ([place]) 01/29/2014  . Osteoporosis of lumbar spine     Past Surgical History:  Procedure Laterality Date  . PACEMAKER IMPLANT N/A 03/19/2017   Procedure: Pacemaker Implant;  Surgeon: Thompson Grayer, MD;  Location: [facility];  Service: Cardiovascular;  Laterality: N/A;    Current Outpatient Prescriptions  Medication Sig Dispense Refill  . acetaminophen (TYLENOL) 500 MG tablet Take 500 mg by mouth every 6 (six) hours as needed.    Marland Kitchen alendronate (FOSAMAX) 35 MG tablet Take 35 mg by  mouth every 7 (seven) days.   2  . Cascara Sagrada-Senna-Nat Lax (BIOHM COLON CLEANSER) CAPS Take by mouth 4 (four) times daily.    Marland Kitchen ibuprofen (ADVIL,MOTRIN) 200 MG tablet Take 200 mg by mouth every 6 (six) hours as needed.    . Multiple Vitamins-Minerals (MULTIVITAMIN ADULT PO) Take by mouth daily.    [name] 3-6-9 Fatty Acids (OMEGA-3-6-9 PO) Take by mouth 2 (two) times daily.    . Probiotic Product (PROBIOTIC PO) Take by mouth 2 (two) times daily.     No current facility-administered medications for this visit.     Allergies:   Celecoxib and Penicillin g   Social History:  The patient  reports that she has never smoked. She has never used smokeless tobacco. She reports that she does not drink alcohol or use drugs.   Family History:  The patient's family history includes Cancer in her mother; Dementia in her mother; Other in her father.  ROS:  Please see the history of present illness.  All other systems are reviewed and otherwise negative.   PHYSICAL EXAM:  VS:  BP 118/64   Pulse 64   Ht 5' 4.5" (1.638 m)   Wt 139 lb (63 kg)   BMI 23.49 kg/m  BMI: Body mass index is 23.49 kg/m. Well nourished, well developed, in no acute distress  HEENT: normocephalic, atraumatic  Neck: no JVD, carotid bruits or masses Cardiac:  RRR; no significant murmurs, no rubs, or gallops Lungs:  CTA b/l, no wheezing, rhonchi  or rales  Abd: soft, nontender MS: no deformity or atrophy Ext:  no edema  Skin: warm and dry, no rash Neuro:  No gross deficits appreciated Psych: euthymic mood, full affect  PPM site is stable, well healed, no tethering, skin changes or discomfort   EKG:  Done today and reviewed by myself shows AV paced, QRS is 1108ms PPM interrogation done today by industry and reviewed by myself: battery and lead measurements are good, acute implant settings remain, HR histogram has shifted left some, though c/w sinus rates 60's-120bpm  03/18/17: TTE Study Conclusions - Left ventricle:  The cavity size was normal. Wall thickness was   normal. Systolic function was normal. The estimated ejection   fraction was in the range of 60% to 65%. Wall motion was normal;   there were no regional wall motion abnormalities. The study is   not technically sufficient to allow evaluation of LV diastolic   function. - Aortic valve: Mildly calcified annulus. Trileaflet; mildly   calcified leaflets. - Mitral valve: There was trivial regurgitation. - Right atrium: Central venous pressure (est): 15 mm Hg. - Atrial septum: No defect or patent foramen ovale was identified. - Tricuspid valve: There was mild regurgitation. - Pulmonary arteries: PA peak pressure: 39 mm Hg (S). - Pericardium, extracardiac: There was no pericardial effusion. Impressions: - Normal LV wall thickness with LVEF 60-65%. Indeterminate   diastolic function. Trivial mitral regurgitation. Mildly   calcified aortic annulus. Mild tricuspid regurgitation with PASP   estimated 39 mmHg.  Recent Labs: 03/17/2017: ALT 17; B Natriuretic Peptide 314.2; Hemoglobin 12.6; Magnesium 2.3; Platelets 264 03/20/2017: BUN 11; Creatinine, Ser 0.65; Potassium 3.6; Sodium 141  No results found for requested labs within last 8760 hours.   CrCl cannot be calculated (Patient's most recent lab result is older than the maximum 21 days allowed.).   Wt Readings from Last 3 Encounters:  05/10/17 139 lb (63 kg)  03/19/17 147 lb 4.8 oz (66.8 kg)  06/16/16 140 lb (63.5 kg)     Other studies reviewed: Additional studies/records reviewed today include: summarized above  ASSESSMENT AND PLAN:  1. DOE     Unclear etiology, suspect deconditioning, remained very sedentary a couple weeks post pacer implant?     Pacer function is intact     Pulmonary exam is normal     No hx of sinus node dysfunction  2. Advanced heart block, PPM     Functioning well, HIS pacing with QRS duration 155ms today   Disposition: Encouraged to slowly increase walking  for exercise and monitor symptoms, notifying us if no improvement, will get CXR pa/lat today, keep her appointments already in place.  Current medicines are reviewed at length with the patient today.  The patient did not have any concerns regarding medicines.  Venetia Night, PA-C 05/10/2017 9:53 AM     [address] [place] [address] [phone] (office)  [phone] (fax)

## 2017-05-08 DIAGNOSIS — Z0000 Encounter for general adult medical examination without abnormal findings: Secondary | ICD-10-CM | POA: Diagnosis not present

## 2017-05-08 DIAGNOSIS — Z23 Encounter for immunization: Secondary | ICD-10-CM | POA: Diagnosis not present

## 2017-05-08 DIAGNOSIS — Z1159 Encounter for screening for other viral diseases: Secondary | ICD-10-CM | POA: Diagnosis not present

## 2017-05-08 DIAGNOSIS — Z6824 Body mass index (BMI) 24.0-24.9, adult: Secondary | ICD-10-CM | POA: Diagnosis not present

## 2017-05-10 ENCOUNTER — Ambulatory Visit (INDEPENDENT_AMBULATORY_CARE_PROVIDER_SITE_OTHER): Payer: BLUE CROSS/BLUE SHIELD | Admitting: Physician Assistant

## 2017-05-10 ENCOUNTER — Ambulatory Visit
Admission: RE | Admit: 2017-05-10 | Discharge: 2017-05-10 | Disposition: A | Discharge status: Home / Self Care | Payer: BLUE CROSS/BLUE SHIELD | Source: Ambulatory Visit | Attending: Physician Assistant | Admitting: Physician Assistant

## 2017-05-10 VITALS — BP 118/64 | HR 64 | Ht 64.5 in | Wt 139.0 lb

## 2017-05-10 DIAGNOSIS — Z950 Presence of cardiac pacemaker: Secondary | ICD-10-CM | POA: Diagnosis not present

## 2017-05-10 DIAGNOSIS — I442 Atrioventricular block, complete: Secondary | ICD-10-CM | POA: Diagnosis not present

## 2017-05-10 DIAGNOSIS — R0602 Shortness of breath: Secondary | ICD-10-CM

## 2017-05-10 NOTE — Patient Instructions (Addendum)
Medication Instructions:   Your physician recommends that you continue on your current medications as directed. Please refer to the Current Medication list given to you today.   If you need a refill on your cardiac medications before your next appointment, please call your pharmacy.  Labwork: NONE ORDERED  TODAY   Testing/Procedures: A chest x-ray takes a picture of the organs and structures inside the chest, including the heart, lungs, and blood vessels. This test can show several things, including, whether the heart is enlarges; whether fluid is building up in the lungs; and whether pacemaker / defibrillator leads are still in place.  Address: [address], [place] [address]     Follow up :  AS SCHEDULED ALREADY    Any Other Special Instructions Will Be Listed Below (If Applicable).

## 2017-05-18 ENCOUNTER — Telehealth: Payer: Self-pay | Admitting: *Deleted

## 2017-05-18 NOTE — Telephone Encounter (Signed)
-----   Message from [facility], [place] sent at 05/10/2017  5:22 PM EDT ----- Please let the patient know her CXR looked good.  No abnormal findings  Thanks renee

## 2017-05-18 NOTE — Telephone Encounter (Signed)
LMOVM OF NORMAL RESULTS AND TO CALL CLINIC IF HAVE ANY QUESTIONS

## 2017-05-19 ENCOUNTER — Encounter: Payer: Self-pay | Admitting: Nurse Practitioner

## 2017-05-30 NOTE — Progress Notes (Signed)
Electrophysiology Office Note Date: 06/01/2017  ID:  Erika Reyes, DOB [birth year], MRN [id number]  PCP: Ronita Hipps, MD Electrophysiologist: Rayann Heman  CC: Pacemaker follow-up  Erika Reyes is a 61 y.o. female seen today for Dr Rayann Heman.  She presents today for routine electrophysiology followup.  Since last being seen in our clinic, the patient reports doing reasonably well.  She continues to not feel as well as she would like. She has stairs at home and is short of breath with 1 flight.  This predates pacemaker implantation.  She has started gradually increasing activity.  She has retired from [employer]. 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   She denies chest pain, palpitations, dyspnea, PND, orthopnea, nausea, vomiting, dizziness, syncope, edema, weight gain, or early satiety.  Device History: MDT dual chamber His Bundle PPM implanted 2018 for complete heart block   Past Medical History:  Diagnosis Date  . Complete heart block (HCC)    a. s/p MDT dual chamber (His Bundle) PPM - Dr Rayann Heman  . Meningioma ([place]) 01/29/2014  . Osteoporosis of lumbar spine    Past Surgical History:  Procedure Laterality Date  . PACEMAKER IMPLANT N/A 03/19/2017   Procedure: Pacemaker Implant;  Surgeon: Thompson Grayer, MD;  Location: [facility];  Service: Cardiovascular;  Laterality: N/A;    Current Outpatient Prescriptions  Medication Sig Dispense Refill  . acetaminophen (TYLENOL) 500 MG tablet Take 500 mg by mouth every 6 (six) hours as needed.    Marland Kitchen alendronate (FOSAMAX) 35 MG tablet Take 35 mg by mouth every 7 (seven) days.   2  . Cascara Sagrada-Senna-Nat Lax (BIOHM COLON CLEANSER) CAPS Take by mouth 4 (four) times daily.    Marland Kitchen ibuprofen (ADVIL,MOTRIN) 200 MG tablet Take 200 mg by mouth every 6 (six) hours as needed.    . Multiple Vitamins-Minerals (MULTIVITAMIN ADULT PO) Take by mouth daily.    [name] 3-6-9 Fatty Acids (OMEGA-3-6-9 PO) Take by mouth 2 (two) times daily.    .  Probiotic Product (PROBIOTIC PO) Take by mouth 2 (two) times daily.     No current facility-administered medications for this visit.     Allergies:   Celecoxib and Penicillin g   Social History: Social History   Social History  . Marital status: Married    Spouse name: N/A  . Number of children: 2  . Years of education: 3 yrs college   Occupational History  . [employer]    Social History Main Topics  . Smoking status: Never Smoker  . Smokeless tobacco: Never Used  . Alcohol use No  . Drug use: No  . Sexual activity: Yes    Partners: Male    Birth control/ protection: None     Comment: Married   Other Topics Concern  . Not on file   Social History Narrative   Lives at home w/ her husband   Right-handed   Caffeine: rare   Owns a restaurant    Family History: Family History  Problem Relation Age of Onset  . Cancer Mother   . Dementia Mother   . Other Father        Cervical stenosis     Review of Systems: All other systems reviewed and are otherwise negative except as noted above.   Physical Exam: VS:  BP 118/72   Pulse 60   Ht 5' 4.5" (1.638 m)   Wt 137 lb 9.6 oz (62.4 kg)   BMI 23.25 kg/m  ,  BMI Body mass index is 23.25 kg/m.  GEN- The patient is well appearing, alert and oriented x 3 today.   HEENT: normocephalic, atraumatic; sclera clear, conjunctiva pink; hearing intact; oropharynx clear; neck supple  Lungs- Clear to ausculation bilaterally, normal work of breathing.  No wheezes, rales, rhonchi Heart- Regular rate and rhythm (paced) GI- soft, non-tender, non-distended, bowel sounds present  Extremities- no clubbing, cyanosis, or edema  MS- no significant deformity or atrophy Skin- warm and dry, no rash or lesion; PPM pocket well healed Psych- euthymic mood, full affect Neuro- strength and sensation are intact  PPM Interrogation- reviewed in detail today,  See PACEART report  EKG:  EKG is ordered today. The ekg ordered today shows sinus  rhythm with selective His capture   Recent Labs: 03/17/2017: ALT 17; B Natriuretic Peptide 314.2; Hemoglobin 12.6; Magnesium 2.3; Platelets 264 03/20/2017: BUN 11; Creatinine, Ser 0.65; Potassium 3.6; Sodium 141   Wt Readings from Last 3 Encounters:  06/01/17 137 lb 9.6 oz (62.4 kg)  05/10/17 139 lb (63 kg)  03/19/17 147 lb 4.8 oz (66.8 kg)     Other studies Reviewed: Additional studies/ records that were reviewed today include: hospital records   Assessment and Plan:  1.  Complete heart block  Normal PPM function See Pace Art report His bundle capture selective today down to  2.75V then non-selective down to loss of capture of 0.75V. RV auto threshold reading as high for last several days. Interestingly, patient has 1:1 conduction today.  I have fixed outputs to 3.5V@1msec  to ensure consistent selective His capture. I also turned rate response on today to see if this would help shortness of breath when climbing stairs. Histograms look good overall. I have encouraged regular aerobic activity. Echo 02/2017 with normal LV function. If she has persistent conduction at next follow up and continued shortness of breath with exertion, could consider MVP with at least intermittent 1:1 conduction.    Current medicines are reviewed at length with the patient today.   The patient does not have concerns regarding her medicines.  The following changes were made today:  none  Labs/ tests ordered today include: none  Orders Placed This Encounter  Procedures  . EKG 12-Lead     Disposition:   Follow up with Dr Rayann Heman as scheduled      Signed, Chanetta Marshall, NP 06/01/2017 11:06 AM  [place] [address] [phone] (office) [phone] (fax)

## 2017-06-01 ENCOUNTER — Encounter: Payer: Self-pay | Admitting: Nurse Practitioner

## 2017-06-01 ENCOUNTER — Ambulatory Visit (INDEPENDENT_AMBULATORY_CARE_PROVIDER_SITE_OTHER): Payer: BLUE CROSS/BLUE SHIELD | Admitting: Nurse Practitioner

## 2017-06-01 VITALS — BP 118/72 | HR 60 | Ht 64.5 in | Wt 137.6 lb

## 2017-06-01 DIAGNOSIS — I442 Atrioventricular block, complete: Secondary | ICD-10-CM

## 2017-06-01 LAB — CUP PACEART INCLINIC DEVICE CHECK
Implantable Lead Implant Date: 20180729
Implantable Lead Model: 5076
Implantable Pulse Generator Implant Date: 20180729
MDC IDC LEAD IMPLANT DT: 20180729
MDC IDC LEAD LOCATION: 753859
MDC IDC LEAD LOCATION: 753860
MDC IDC SESS DTM: 20181011111216

## 2017-06-01 NOTE — Patient Instructions (Addendum)
Medication Instructions:   Your physician recommends that you continue on your current medications as directed. Please refer to the Current Medication list given to you today.   If you need a refill on your cardiac medications before your next appointment, please call your pharmacy.  Labwork: NONE ORDERED  TODAY    Testing/Procedures: NONE ORDERED  TODAY    Follow-Up:  KEEP AS SCHEDULED    Any Other Special Instructions Will Be Listed Below (If Applicable).

## 2017-06-19 ENCOUNTER — Ambulatory Visit (INDEPENDENT_AMBULATORY_CARE_PROVIDER_SITE_OTHER): Payer: BLUE CROSS/BLUE SHIELD | Admitting: Internal Medicine

## 2017-06-19 ENCOUNTER — Encounter: Payer: Self-pay | Admitting: Internal Medicine

## 2017-06-19 VITALS — BP 122/64 | HR 72 | Ht 64.5 in | Wt 139.4 lb

## 2017-06-19 DIAGNOSIS — I442 Atrioventricular block, complete: Secondary | ICD-10-CM | POA: Diagnosis not present

## 2017-06-19 DIAGNOSIS — R0602 Shortness of breath: Secondary | ICD-10-CM

## 2017-06-19 LAB — CUP PACEART INCLINIC DEVICE CHECK
Battery Remaining Longevity: 71 mo
Battery Voltage: 3.06 V
Brady Statistic AP VP Percent: 35.76 %
Brady Statistic AP VS Percent: 0.08 %
Brady Statistic AS VS Percent: 0.03 %
Implantable Lead Model: 5076
Implantable Pulse Generator Implant Date: 20180729
Lead Channel Impedance Value: 285 Ohm
Lead Channel Impedance Value: 361 Ohm
Lead Channel Impedance Value: 456 Ohm
Lead Channel Pacing Threshold Amplitude: 2.5 V
Lead Channel Pacing Threshold Pulse Width: 0.4 ms
Lead Channel Sensing Intrinsic Amplitude: 2.5 mV
Lead Channel Setting Pacing Amplitude: 2 V
Lead Channel Setting Pacing Pulse Width: 1 ms
MDC IDC LEAD IMPLANT DT: 20180729
MDC IDC LEAD IMPLANT DT: 20180729
MDC IDC LEAD LOCATION: 753859
MDC IDC LEAD LOCATION: 753860
MDC IDC MSMT LEADCHNL RA PACING THRESHOLD AMPLITUDE: 0.375 V
MDC IDC MSMT LEADCHNL RA PACING THRESHOLD PULSEWIDTH: 0.4 ms
MDC IDC MSMT LEADCHNL RA SENSING INTR AMPL: 2.125 mV
MDC IDC MSMT LEADCHNL RA SENSING INTR AMPL: 2.25 mV
MDC IDC MSMT LEADCHNL RV IMPEDANCE VALUE: 418 Ohm
MDC IDC MSMT LEADCHNL RV SENSING INTR AMPL: 3.25 mV
MDC IDC SESS DTM: 20181029123909
MDC IDC SET LEADCHNL RV PACING AMPLITUDE: 3.5 V
MDC IDC SET LEADCHNL RV SENSING SENSITIVITY: 0.9 mV
MDC IDC STAT BRADY AS VP PERCENT: 64.13 %
MDC IDC STAT BRADY RA PERCENT PACED: 35.8 %
MDC IDC STAT BRADY RV PERCENT PACED: 99.89 %

## 2017-06-19 NOTE — Progress Notes (Signed)
    PCP: Ronita Hipps, MD   Primary EP:  Dr Rayann Heman  Erika Reyes is a 61 y.o. female who presents today for routine electrophysiology followup.  Since last being seen in our clinic, the patient reports doing very well.  She had rate response turned on last visit and is doing very well.  Her SOB has resolved.  Today, she denies symptoms of palpitations, chest pain, shortness of breath,  lower extremity edema, dizziness, presyncope, or syncope.  The patient is otherwise without complaint today.   Past Medical History:  Diagnosis Date  . Complete heart block (HCC)    a. s/p MDT dual chamber (His Bundle) PPM - Dr Rayann Heman  . Meningioma ([place]) 01/29/2014  . Osteoporosis of lumbar spine    Past Surgical History:  Procedure Laterality Date  . PACEMAKER IMPLANT N/A 03/19/2017   Procedure: Pacemaker Implant;  Surgeon: Thompson Grayer, MD;  Location: [facility];  Service: Cardiovascular;  Laterality: N/A;    ROS- all systems are reviewed and negative except as per HPI above  Current Outpatient Prescriptions  Medication Sig Dispense Refill  . acetaminophen (TYLENOL) 500 MG tablet Take 500 mg by mouth every 6 (six) hours as needed (pain).     Marland Kitchen alendronate (FOSAMAX) 35 MG tablet Take 35 mg by mouth every 7 (seven) days.   2  . Cascara Sagrada-Senna-Nat Lax (BIOHM COLON CLEANSER) CAPS Take 2 capsules by mouth 2 (two) times daily.     Marland Kitchen ibuprofen (ADVIL,MOTRIN) 200 MG tablet Take 200 mg by mouth every 6 (six) hours as needed (pain).     . Multiple Vitamins-Minerals (MULTIVITAMIN ADULT PO) Take 2 tablets by mouth every morning.     [name] 3-6-9 Fatty Acids (OMEGA-3-6-9 PO) Take 2 capsules by mouth every evening.     . Probiotic Product (PROBIOTIC PO) Take 2 capsules by mouth every evening.      No current facility-administered medications for this visit.     Physical Exam: Vitals:   06/19/17 0919  BP: 122/64  Pulse: 72  SpO2: 99%  Weight: 139 lb 6.4 oz (63.2 kg)  Height: 5' 4.5"  (1.638 m)    GEN- The patient is well appearing, alert and oriented x 3 today.   Head- normocephalic, atraumatic Eyes-  Sclera clear, conjunctiva pink Ears- hearing intact Oropharynx- clear Lungs- Clear to ausculation bilaterally, normal work of breathing Chest- pacemaker pocket is well healed Heart- Regular rate and rhythm, no murmurs, rubs or gallops, PMI not laterally displaced GI- soft, NT, ND, + BS Extremities- no clubbing, cyanosis, or edema  Pacemaker interrogation- reviewed in detail today,  See PACEART report  ekg tracing ordered today is personally reviewed and shows AV paced rhythm  Assessment and Plan:  1. Symptomatic complete heart block Normal pacemaker function See Pace Art report No changes today Today, selective capture is above 2.5V @ 1 msec.  Nonselective capture loss is at 1.5V @ 1 msec Will keep 3.5V @ 1 msec at this time. No changes today  2. SOB Resolved with rate response Could consider reducing RV pacing output upon return to preserve battery, though given significant improvement in symptoms, I am reluctant to make changes today.  carelink Return to see EP NP in 6 months  Thompson Grayer MD, [facility] 06/19/2017 9:26 AM

## 2017-06-19 NOTE — Patient Instructions (Addendum)
Medication Instructions:  Your physician recommends that you continue on your current medications as directed. Please refer to the Current Medication list given to you today.  -- If you need a refill on your cardiac medications before your next appointment, please call your pharmacy. --  Labwork: None ordered  Testing/Procedures: None ordered  Follow-Up: Your physician wants you to follow-up in: 6 months with Chanetta Marshall NP.  You will receive a reminder letter in the mail two months in advance. If you don't receive a letter, please call our office to schedule the follow-up appointment.  Remote monitoring is used to monitor your Pacemaker from home. This monitoring reduces the number of office visits required to check your device to one time per year. It allows [place] to keep an eye on the functioning of your device to ensure it is working properly. You are scheduled for a device check from home on 09/04/2017. You may send your transmission at any time that day. If you have a wireless device, the transmission will be sent automatically. After your physician reviews your transmission, you will receive a postcard with your next transmission date.    Thank you for choosing [facility]!!   Frederik Schmidt, RN [phone]  Any Other Special Instructions Will Be Listed Below (If Applicable).

## 2017-07-28 DIAGNOSIS — H1859 Other hereditary corneal dystrophies: Secondary | ICD-10-CM | POA: Diagnosis not present

## 2017-07-28 DIAGNOSIS — Z1231 Encounter for screening mammogram for malignant neoplasm of breast: Secondary | ICD-10-CM | POA: Diagnosis not present

## 2017-07-28 DIAGNOSIS — Z803 Family history of malignant neoplasm of breast: Secondary | ICD-10-CM | POA: Diagnosis not present

## 2017-07-28 DIAGNOSIS — H52203 Unspecified astigmatism, bilateral: Secondary | ICD-10-CM | POA: Diagnosis not present

## 2017-08-24 DIAGNOSIS — D2239 Melanocytic nevi of other parts of face: Secondary | ICD-10-CM | POA: Diagnosis not present

## 2017-08-24 DIAGNOSIS — D1801 Hemangioma of skin and subcutaneous tissue: Secondary | ICD-10-CM | POA: Diagnosis not present

## 2017-08-24 DIAGNOSIS — D225 Melanocytic nevi of trunk: Secondary | ICD-10-CM | POA: Diagnosis not present

## 2017-08-24 DIAGNOSIS — L821 Other seborrheic keratosis: Secondary | ICD-10-CM | POA: Diagnosis not present

## 2017-09-04 ENCOUNTER — Ambulatory Visit (INDEPENDENT_AMBULATORY_CARE_PROVIDER_SITE_OTHER): Payer: Self-pay | Admitting: *Deleted

## 2017-09-04 DIAGNOSIS — I442 Atrioventricular block, complete: Secondary | ICD-10-CM

## 2017-09-06 LAB — CUP PACEART REMOTE DEVICE CHECK
Battery Voltage: 3.01 V
Brady Statistic AP VP Percent: 30.34 %
Brady Statistic RV Percent Paced: 99.93 %
Date Time Interrogation Session: 20190114023549
Implantable Lead Implant Date: 20180729
Implantable Lead Location: 753859
Implantable Pulse Generator Implant Date: 20180729
Lead Channel Impedance Value: 285 Ohm
Lead Channel Impedance Value: 380 Ohm
Lead Channel Pacing Threshold Amplitude: 0.5 V
Lead Channel Setting Pacing Amplitude: 2 V
Lead Channel Setting Sensing Sensitivity: 0.9 mV
MDC IDC LEAD IMPLANT DT: 20180729
MDC IDC LEAD LOCATION: 753860
MDC IDC MSMT BATTERY REMAINING LONGEVITY: 64 mo
MDC IDC MSMT LEADCHNL RA IMPEDANCE VALUE: 380 Ohm
MDC IDC MSMT LEADCHNL RA IMPEDANCE VALUE: 456 Ohm
MDC IDC MSMT LEADCHNL RA PACING THRESHOLD PULSEWIDTH: 0.4 ms
MDC IDC MSMT LEADCHNL RA SENSING INTR AMPL: 3.25 mV
MDC IDC MSMT LEADCHNL RA SENSING INTR AMPL: 3.25 mV
MDC IDC MSMT LEADCHNL RV PACING THRESHOLD AMPLITUDE: 2.25 V
MDC IDC MSMT LEADCHNL RV PACING THRESHOLD PULSEWIDTH: 0.4 ms
MDC IDC MSMT LEADCHNL RV SENSING INTR AMPL: 3.125 mV
MDC IDC MSMT LEADCHNL RV SENSING INTR AMPL: 3.125 mV
MDC IDC SET LEADCHNL RV PACING AMPLITUDE: 3.5 V
MDC IDC SET LEADCHNL RV PACING PULSEWIDTH: 1 ms
MDC IDC STAT BRADY AP VS PERCENT: 0.06 %
MDC IDC STAT BRADY AS VP PERCENT: 69.59 %
MDC IDC STAT BRADY AS VS PERCENT: 0.01 %
MDC IDC STAT BRADY RA PERCENT PACED: 30.37 %

## 2017-09-06 NOTE — Progress Notes (Signed)
Remote pacemaker transmission.   

## 2017-09-08 ENCOUNTER — Encounter: Payer: Self-pay | Admitting: Cardiology

## 2017-10-16 ENCOUNTER — Telehealth: Payer: Self-pay | Admitting: Internal Medicine

## 2017-10-16 NOTE — Telephone Encounter (Signed)
Gave patient number to medtronic tech services for support.

## 2017-10-16 NOTE — Telephone Encounter (Signed)
New Message   1. Has your device fired? no  2. Is you device beeping? no  3. Are you experiencing draining or swelling at device site? no  4. Are you calling to see if we received your device transmission? no  5. Have you passed out? no  Pt says that she is going to [place] on Friday and she wants to wear a magic band that has radio frequency and it says on the band to keep 9 inches away from pace maker. Please call  Please route to [place]

## 2017-11-16 DIAGNOSIS — R21 Rash and other nonspecific skin eruption: Secondary | ICD-10-CM | POA: Diagnosis not present

## 2017-11-16 DIAGNOSIS — W57XXXA Bitten or stung by nonvenomous insect and other nonvenomous arthropods, initial encounter: Secondary | ICD-10-CM | POA: Diagnosis not present

## 2017-11-16 DIAGNOSIS — Z6826 Body mass index (BMI) 26.0-26.9, adult: Secondary | ICD-10-CM | POA: Diagnosis not present

## 2017-11-20 NOTE — Progress Notes (Signed)
Dictation #1 [id number]  [id number]    Electrophysiology Office Note Date: 11/21/2017  ID:  Erika Reyes, DOB 1955-10-28, MRN [id number]  PCP: Ronita Hipps, MD Electrophysiologist: Rayann Heman  CC: Pacemaker follow-up  Erika Reyes is a 62 y.o. female seen today for Dr Rayann Heman.  She presents today for routine electrophysiology followup.  Since last being seen in our clinic, the patient reports doing relatively well.  Her mom passed away recently and she is still grieving.  She has gained 20 pounds since her mom died.  She has noticed some increase in shortness of breath with this during activity. She has also been somewhat less active. She still works occasionally at her [employer].   She denies chest pain, palpitations, dyspnea, PND, orthopnea, nausea, vomiting, dizziness, syncope, edema, weight gain, or early satiety.  Device History: MDT dual chamber His Bundle PPM implanted 2018 for complete heart block    Past Medical History:  Diagnosis Date  . Complete heart block (HCC)    a. s/p MDT dual chamber (His Bundle) PPM - Dr Rayann Heman  . Meningioma ([place]) 01/29/2014  . Osteoporosis of lumbar spine    Past Surgical History:  Procedure Laterality Date  . PACEMAKER IMPLANT N/A 03/19/2017   Procedure: Pacemaker Implant;  Surgeon: Thompson Grayer, MD;  Location: [facility];  Service: Cardiovascular;  Laterality: N/A;    Current Outpatient Medications  Medication Sig Dispense Refill  . acetaminophen (TYLENOL) 500 MG tablet Take 500 mg by mouth every 6 (six) hours as needed (pain).     Marland Kitchen alendronate (FOSAMAX) 35 MG tablet Take 35 mg by mouth every 7 (seven) days.   2  . Cascara Sagrada-Senna-Nat Lax (BIOHM COLON CLEANSER) CAPS Take 2 capsules by mouth 2 (two) times daily.     Marland Kitchen doxycycline (MONODOX) 100 MG capsule Take 100 mg by mouth 2 (two) times daily. Take for 7 days  0  . ibuprofen (ADVIL,MOTRIN) 200 MG tablet Take 200 mg by mouth every 6 (six) hours as needed (pain).     .  Multiple Vitamins-Minerals (MULTIVITAMIN ADULT PO) Take 2 tablets by mouth every morning.     [name] 3-6-9 Fatty Acids (OMEGA-3-6-9 PO) Take 2 capsules by mouth every evening.     . Probiotic Product (PROBIOTIC PO) Take 2 capsules by mouth every evening.      No current facility-administered medications for this visit.     Allergies:   Celecoxib and Penicillin g   Social History: Social History   Socioeconomic History  . Marital status: Married    Spouse name: Not on file  . Number of children: 2  . Years of education: 3 yrs college  . Highest education level: Not on file  Occupational History  . 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 Financial resource strain: Not on file  . Food insecurity:    Worry: Not on file    Inability: Not on file  . Transportation needs:    Medical: Not on file    Non-medical: Not on file  Tobacco Use  . Smoking status: Never Smoker  . Smokeless tobacco: Never Used  Substance and Sexual Activity  . Alcohol use: No  . Drug use: No  . Sexual activity: Yes    Partners: Male    Birth control/protection: None    Comment: Married  Lifestyle  . Physical activity:    Days per week: Not on file    Minutes per session: Not on file  . Stress: Not on  file  Relationships  . Social connections:    Talks on phone: Not on file    Gets together: Not on file    Attends religious service: Not on file    Active member of club or organization: Not on file    Attends meetings of clubs or organizations: Not on file    Relationship status: Not on file  . Intimate partner violence:    Fear of current or ex partner: Not on file    Emotionally abused: Not on file    Physically abused: Not on file    Forced sexual activity: Not on file  Other Topics Concern  . Not on file  Social History Narrative   Lives at home w/ her husband   Right-handed   Caffeine: rare   Owns a restaurant    Family History: Family History  Problem Relation Age of Onset  .  Cancer Mother   . Dementia Mother   . Other Father        Cervical stenosis     Review of Systems: All other systems reviewed and are otherwise negative except as noted above.   Physical Exam: VS:  BP 126/86   Pulse 63   Ht 5' 4.5" (1.638 m)   Wt 156 lb 6.4 oz (70.9 kg)   SpO2 99%   BMI 26.43 kg/m  , BMI Body mass index is 26.43 kg/m.  GEN- The patient is well appearing, alert and oriented x 3 today.   HEENT: normocephalic, atraumatic; sclera clear, conjunctiva pink; hearing intact; oropharynx clear; neck supple  Lungs- Clear to ausculation bilaterally, normal work of breathing.  No wheezes, rales, rhonchi Heart- Regular rate and rhythm (paced) GI- soft, non-tender, non-distended, bowel sounds present  Extremities- no clubbing, cyanosis, or edema  MS- no significant deformity or atrophy Skin- warm and dry, no rash or lesion; PPM pocket well healed Psych- euthymic mood, full affect Neuro- strength and sensation are intact  PPM Interrogation- reviewed in detail today,  See PACEART report  EKG:  EKG is not ordered today.  Recent Labs: 03/17/2017: ALT 17; B Natriuretic Peptide 314.2; Hemoglobin 12.6; Magnesium 2.3; Platelets 264 03/20/2017: BUN 11; Creatinine, Ser 0.65; Potassium 3.6; Sodium 141   Wt Readings from Last 3 Encounters:  11/21/17 156 lb 6.4 oz (70.9 kg)  06/19/17 139 lb 6.4 oz (63.2 kg)  06/01/17 137 lb 9.6 oz (62.4 kg)     Other studies Reviewed: Additional studies/ records that were reviewed today include:Dr Allred's office notes  Assessment and Plan:  1.  Symptomatic complete heart block Normal PPM function - pt is device dependent today  See Pace Art report No changes today Selective His Bundle pacing >2.5V@1msec . Will leave outputs as is for now with improvement in symptoms   2.  Shortness of breath Likely 2/2 weight gain and deconditioning Will check labs today Encouraged regular aerobic activity    Current medicines are reviewed at length  with the patient today.   The patient does not have concerns regarding her medicines.  The following changes were made today:  none  Labs/ tests ordered today include: none Orders Placed This Encounter  Procedures  . Basic metabolic panel  . CBC  . TSH  . CUP PACEART INCLINIC DEVICE CHECK     Disposition:   Follow up with [facility], me in 6 months    Signed, Chanetta Marshall, NP 11/21/2017 9:17 AM  [place] [address] [phone] (office) [phone] (  fax)

## 2017-11-21 ENCOUNTER — Ambulatory Visit (INDEPENDENT_AMBULATORY_CARE_PROVIDER_SITE_OTHER): Payer: BLUE CROSS/BLUE SHIELD | Admitting: Nurse Practitioner

## 2017-11-21 ENCOUNTER — Encounter: Payer: Self-pay | Admitting: Nurse Practitioner

## 2017-11-21 ENCOUNTER — Encounter (INDEPENDENT_AMBULATORY_CARE_PROVIDER_SITE_OTHER): Payer: Self-pay

## 2017-11-21 VITALS — BP 126/86 | HR 63 | Ht 64.5 in | Wt 156.4 lb

## 2017-11-21 DIAGNOSIS — I442 Atrioventricular block, complete: Secondary | ICD-10-CM

## 2017-11-21 DIAGNOSIS — R0602 Shortness of breath: Secondary | ICD-10-CM

## 2017-11-21 LAB — CUP PACEART INCLINIC DEVICE CHECK
Implantable Lead Implant Date: 20180729
Implantable Lead Implant Date: 20180729
Implantable Lead Model: 5076
Implantable Pulse Generator Implant Date: 20180729
MDC IDC LEAD LOCATION: 753859
MDC IDC LEAD LOCATION: 753860
MDC IDC SESS DTM: 20190402091112

## 2017-11-21 LAB — CBC
HEMATOCRIT: 41.5 % (ref 34.0–46.6)
Hemoglobin: 14.1 g/dL (ref 11.1–15.9)
MCH: 30.2 pg (ref 26.6–33.0)
MCHC: 34 g/dL (ref 31.5–35.7)
MCV: 89 fL (ref 79–97)
PLATELETS: 332 10*3/uL (ref 150–379)
RBC: 4.67 x10E6/uL (ref 3.77–5.28)
RDW: 13.6 % (ref 12.3–15.4)
WBC: 9.3 10*3/uL (ref 3.4–10.8)

## 2017-11-21 LAB — BASIC METABOLIC PANEL
BUN / CREAT RATIO: 27 (ref 12–28)
BUN: 16 mg/dL (ref 8–27)
CHLORIDE: 102 mmol/L (ref 96–106)
CO2: 27 mmol/L (ref 20–29)
Calcium: 11.1 mg/dL — ABNORMAL HIGH (ref 8.7–10.3)
Creatinine, Ser: 0.6 mg/dL (ref 0.57–1.00)
GFR calc Af Amer: 114 mL/min/{1.73_m2} (ref >59)
GFR calc non Af Amer: 99 mL/min/{1.73_m2} (ref >59)
GLUCOSE: 85 mg/dL (ref 65–99)
Potassium: 5.5 mmol/L — ABNORMAL HIGH (ref 3.5–5.2)
SODIUM: 142 mmol/L (ref 134–144)

## 2017-11-21 LAB — TSH: TSH: 2.89 u[IU]/mL (ref 0.450–4.500)

## 2017-11-21 NOTE — Patient Instructions (Addendum)
Medication Instructions:   Your physician recommends that you continue on your current medications as directed. Please refer to the Current Medication list given to you today.    If you need a refill on your cardiac medications before your next appointment, please call your pharmacy.  Labwork: CBC TSH AND BMET    Testing/Procedures: NONE ORDERED  TODAY    Follow-Up: Your physician wants you to follow-up in: [place] will receive a reminder letter in the mail two months in advance. If you don't receive a letter, please call our office to schedule the follow-up appointment.   Your physician wants you to follow-up in:  IN  [place] will receive a reminder letter in the mail two months in advance. If you don't receive a letter, please call our office to schedule the follow-up appointment.   Remote monitoring is used to monitor your Pacemaker of ICD from home. This monitoring reduces the number of office visits required to check your device to one time per year. It allows [place] to keep an eye on the functioning of your device to ensure it is working properly. You are scheduled for a device check from home on.Marland Kitchen4-15-19 You may send your transmission at any time that day. If you have a wireless device, the transmission will be sent automatically. After your physician reviews your transmission, you will receive a postcard with your next transmission date.     Any Other Special Instructions Will Be Listed Below (If Applicable).                                                                                                                                                 ]

## 2017-11-24 ENCOUNTER — Telehealth: Payer: Self-pay | Admitting: Internal Medicine

## 2017-11-24 NOTE — Telephone Encounter (Signed)
Patient informed of lab results. Discussed high potassium foods/drinks to avoid.

## 2017-11-24 NOTE — Telephone Encounter (Signed)
New message  Pt verbalized that she is returning call for RN  For her lab results

## 2017-12-04 ENCOUNTER — Ambulatory Visit (INDEPENDENT_AMBULATORY_CARE_PROVIDER_SITE_OTHER): Payer: BLUE CROSS/BLUE SHIELD | Admitting: *Deleted

## 2017-12-04 DIAGNOSIS — I442 Atrioventricular block, complete: Secondary | ICD-10-CM | POA: Diagnosis not present

## 2017-12-04 NOTE — Progress Notes (Signed)
Remote pacemaker transmission.   

## 2017-12-06 ENCOUNTER — Encounter: Payer: Self-pay | Admitting: Cardiology

## 2017-12-06 LAB — CUP PACEART REMOTE DEVICE CHECK
Battery Voltage: 2.98 V
Brady Statistic AP VP Percent: 23.89 %
Brady Statistic AS VS Percent: 0 %
Brady Statistic RA Percent Paced: 23.87 %
Brady Statistic RV Percent Paced: 100 %
Implantable Lead Implant Date: 20180729
Implantable Lead Location: 753859
Implantable Lead Location: 753860
Implantable Lead Model: 3830
Implantable Pulse Generator Implant Date: 20180729
Lead Channel Impedance Value: 285 Ohm
Lead Channel Impedance Value: 399 Ohm
Lead Channel Impedance Value: 418 Ohm
Lead Channel Pacing Threshold Amplitude: 0.5 V
Lead Channel Pacing Threshold Amplitude: 2.5 V
Lead Channel Pacing Threshold Pulse Width: 0.4 ms
Lead Channel Setting Pacing Amplitude: 2 V
Lead Channel Setting Pacing Amplitude: 3.5 V
Lead Channel Setting Pacing Pulse Width: 1 ms
Lead Channel Setting Sensing Sensitivity: 0.9 mV
MDC IDC LEAD IMPLANT DT: 20180729
MDC IDC MSMT BATTERY REMAINING LONGEVITY: 61 mo
MDC IDC MSMT LEADCHNL RA IMPEDANCE VALUE: 342 Ohm
MDC IDC MSMT LEADCHNL RA SENSING INTR AMPL: 2.375 mV
MDC IDC MSMT LEADCHNL RA SENSING INTR AMPL: 2.375 mV
MDC IDC MSMT LEADCHNL RV PACING THRESHOLD PULSEWIDTH: 0.4 ms
MDC IDC MSMT LEADCHNL RV SENSING INTR AMPL: 3.25 mV
MDC IDC MSMT LEADCHNL RV SENSING INTR AMPL: 3.25 mV
MDC IDC SESS DTM: 20190415035524
MDC IDC STAT BRADY AP VS PERCENT: 0 %
MDC IDC STAT BRADY AS VP PERCENT: 76.11 %

## 2017-12-06 NOTE — Progress Notes (Signed)
Letter  

## 2018-03-05 ENCOUNTER — Ambulatory Visit (INDEPENDENT_AMBULATORY_CARE_PROVIDER_SITE_OTHER): Payer: BLUE CROSS/BLUE SHIELD | Admitting: *Deleted

## 2018-03-05 DIAGNOSIS — I442 Atrioventricular block, complete: Secondary | ICD-10-CM | POA: Diagnosis not present

## 2018-03-05 NOTE — Progress Notes (Signed)
Remote pacemaker transmission.   

## 2018-03-06 LAB — CUP PACEART REMOTE DEVICE CHECK
Battery Remaining Longevity: 58 mo
Battery Voltage: 2.97 V
Brady Statistic AS VS Percent: 0.02 %
Date Time Interrogation Session: 20190715052039
Implantable Lead Implant Date: 20180729
Implantable Lead Implant Date: 20180729
Implantable Lead Model: 3830
Implantable Pulse Generator Implant Date: 20180729
Lead Channel Impedance Value: 342 Ohm
Lead Channel Impedance Value: 399 Ohm
Lead Channel Impedance Value: 399 Ohm
Lead Channel Pacing Threshold Amplitude: 0.375 V
Lead Channel Pacing Threshold Amplitude: 2.5 V
Lead Channel Pacing Threshold Pulse Width: 0.4 ms
Lead Channel Sensing Intrinsic Amplitude: 2.5 mV
Lead Channel Sensing Intrinsic Amplitude: 2.5 mV
Lead Channel Setting Sensing Sensitivity: 0.9 mV
MDC IDC LEAD LOCATION: 753859
MDC IDC LEAD LOCATION: 753860
MDC IDC MSMT LEADCHNL RA PACING THRESHOLD PULSEWIDTH: 0.4 ms
MDC IDC MSMT LEADCHNL RV IMPEDANCE VALUE: 266 Ohm
MDC IDC MSMT LEADCHNL RV SENSING INTR AMPL: 2.875 mV
MDC IDC MSMT LEADCHNL RV SENSING INTR AMPL: 2.875 mV
MDC IDC SET LEADCHNL RA PACING AMPLITUDE: 2 V
MDC IDC SET LEADCHNL RV PACING AMPLITUDE: 3.5 V
MDC IDC SET LEADCHNL RV PACING PULSEWIDTH: 1 ms
MDC IDC STAT BRADY AP VP PERCENT: 29.62 %
MDC IDC STAT BRADY AP VS PERCENT: 0.03 %
MDC IDC STAT BRADY AS VP PERCENT: 70.33 %
MDC IDC STAT BRADY RA PERCENT PACED: 29.62 %
MDC IDC STAT BRADY RV PERCENT PACED: 99.95 %

## 2018-03-07 ENCOUNTER — Encounter: Payer: Self-pay | Admitting: Cardiology

## 2018-04-30 NOTE — Progress Notes (Signed)
Electrophysiology Office Note Date: 05/03/2018  ID:  Erika Reyes, DOB May 28, 1956, MRN [id number]  PCP: Ronita Hipps, MD Electrophysiologist: Rayann Heman  CC: Pacemaker follow-up  Erika Reyes is a 62 y.o. female seen today for Dr Rayann Heman.  She presents today for routine electrophysiology followup.  Since last being seen in our clinic, the patient reports doing very well.  She denies chest pain, palpitations, dyspnea, PND, orthopnea, nausea, vomiting, dizziness, syncope, edema, weight gain, or early satiety.  Device History: MDT dual chamber His Bundle PPM implanted 2018 for complete heart block   Past Medical History:  Diagnosis Date  . Complete heart block (HCC)    a. s/p MDT dual chamber (His Bundle) PPM - Dr Rayann Heman  . Meningioma ([place]) 01/29/2014  . Osteoporosis of lumbar spine    Past Surgical History:  Procedure Laterality Date  . PACEMAKER IMPLANT N/A 03/19/2017   Procedure: Pacemaker Implant;  Surgeon: Thompson Grayer, MD;  Location: [facility];  Service: Cardiovascular;  Laterality: N/A;    Current Outpatient Medications  Medication Sig Dispense Refill  . acetaminophen (TYLENOL) 500 MG tablet Take 500 mg by mouth every 6 (six) hours as needed (pain).     Marland Kitchen alendronate (FOSAMAX) 35 MG tablet Take 35 mg by mouth every 7 (seven) days.   2  . Cascara Sagrada-Senna-Nat Lax (BIOHM COLON CLEANSER) CAPS Take 2 capsules by mouth 2 (two) times daily.     Marland Kitchen doxycycline (MONODOX) 100 MG capsule Take 100 mg by mouth 2 (two) times daily. Take for 7 days  0  . ibuprofen (ADVIL,MOTRIN) 200 MG tablet Take 200 mg by mouth every 6 (six) hours as needed (pain).     . Multiple Vitamins-Minerals (MULTIVITAMIN ADULT PO) Take 2 tablets by mouth every morning.     [name] 3-6-9 Fatty Acids (OMEGA-3-6-9 PO) Take 2 capsules by mouth every evening.     . Probiotic Product (PROBIOTIC PO) Take 2 capsules by mouth every evening.      No current facility-administered medications for this visit.      Allergies:   Celecoxib and Penicillin g   Social History: Social History   Socioeconomic History  . Marital status: Married    Spouse name: Not on file  . Number of children: 2  . Years of education: 3 yrs college  . Highest education level: Not on file  Occupational History  . 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 Financial resource strain: Not on file  . Food insecurity:    Worry: Not on file    Inability: Not on file  . Transportation needs:    Medical: Not on file    Non-medical: Not on file  Tobacco Use  . Smoking status: Never Smoker  . Smokeless tobacco: Never Used  Substance and Sexual Activity  . Alcohol use: No  . Drug use: No  . Sexual activity: Yes    Partners: Male    Birth control/protection: None    Comment: Married  Lifestyle  . Physical activity:    Days per week: Not on file    Minutes per session: Not on file  . Stress: Not on file  Relationships  . Social connections:    Talks on phone: Not on file    Gets together: Not on file    Attends religious service: Not on file    Active member of club or organization: Not on file    Attends meetings of clubs or organizations: Not on file  Relationship status: Not on file  . Intimate partner violence:    Fear of current or ex partner: Not on file    Emotionally abused: Not on file    Physically abused: Not on file    Forced sexual activity: Not on file  Other Topics Concern  . Not on file  Social History Narrative   Lives at home w/ her husband   Right-handed   Caffeine: rare   Owns a restaurant    Family History: Family History  Problem Relation Age of Onset  . Cancer Mother   . Dementia Mother   . Other Father        Cervical stenosis     Review of Systems: All other systems reviewed and are otherwise negative except as noted above.   Physical Exam: VS:  BP 118/76   Pulse 85   Ht 5' 4.5" (1.638 m)   Wt 144 lb 12.8 oz (65.7 kg)   SpO2 96%   BMI 24.47 kg/m  , BMI Body  mass index is 24.47 kg/m.  GEN- The patient is well appearing, alert and oriented x 3 today.   HEENT: normocephalic, atraumatic; sclera clear, conjunctiva pink; hearing intact; oropharynx clear; neck supple  Lungs- Clear to ausculation bilaterally, normal work of breathing.  No wheezes, rales, rhonchi Heart- Regular rate and rhythm (paced) GI- soft, non-tender, non-distended, bowel sounds present  Extremities- no clubbing, cyanosis, or edema  MS- no significant deformity or atrophy Skin- warm and dry, no rash or lesion; PPM pocket well healed Psych- euthymic mood, full affect Neuro- strength and sensation are intact  PPM Interrogation- reviewed in detail today,  See PACEART report  EKG:  EKG is not ordered today.  Recent Labs: 11/21/2017: BUN 16; Creatinine, Ser 0.60; Hemoglobin 14.1; Platelets 332; Potassium 5.5; Sodium 142; TSH 2.890   Wt Readings from Last 3 Encounters:  05/03/18 144 lb 12.8 oz (65.7 kg)  11/21/17 156 lb 6.4 oz (70.9 kg)  06/19/17 139 lb 6.4 oz (63.2 kg)      Assessment and Plan:  1.  Complete heart block Normal PPM function See Pace Art report No changes today  2.  Shortness of breath Improved with intentional weight loss.   Current medicines are reviewed at length with the patient today.   The patient does not have concerns regarding her medicines.  The following changes were made today:  none  Labs/ tests ordered today include: none Orders Placed This Encounter  Procedures  . CUP PACEART INCLINIC DEVICE CHECK     Disposition:   Follow up with [facility], me in 1 year     Signed, Chanetta Marshall, NP 05/03/2018 10:53 AM  [facility] [place] [address] [phone] (office) [phone] (fax)

## 2018-05-03 ENCOUNTER — Ambulatory Visit (INDEPENDENT_AMBULATORY_CARE_PROVIDER_SITE_OTHER): Payer: BLUE CROSS/BLUE SHIELD | Admitting: Nurse Practitioner

## 2018-05-03 ENCOUNTER — Encounter: Payer: Self-pay | Admitting: Nurse Practitioner

## 2018-05-03 VITALS — BP 118/76 | HR 85 | Ht 64.5 in | Wt 144.8 lb

## 2018-05-03 DIAGNOSIS — R0602 Shortness of breath: Secondary | ICD-10-CM | POA: Diagnosis not present

## 2018-05-03 DIAGNOSIS — I442 Atrioventricular block, complete: Secondary | ICD-10-CM

## 2018-05-03 LAB — CUP PACEART INCLINIC DEVICE CHECK
Date Time Interrogation Session: 20190912101624
Implantable Lead Implant Date: 20180729
Implantable Lead Implant Date: 20180729
Implantable Lead Location: 753859
Implantable Lead Location: 753860
Implantable Lead Model: 3830
MDC IDC PG IMPLANT DT: 20180729

## 2018-05-03 NOTE — Patient Instructions (Addendum)
Medication Instructions:   Your physician recommends that you continue on your current medications as directed. Please refer to the Current Medication list given to you today.   If you need a refill on your cardiac medications before your next appointment, please call your pharmacy.  Labwork: NONE ORDERED  TODAY    Testing/Procedures: NONE ORDERED  TODAY    Follow-Up:  Your physician wants you to follow-up in: [place] will receive a reminder letter in the mail two months in advance. If you don't receive a letter, please call our office to schedule the follow-up appointment.  Remote monitoring is used to monitor your Pacemaker of ICD from home. This monitoring reduces the number of office visits required to check your device to one time per year. It allows [place] to keep an eye on the functioning of your device to ensure it is working properly. You are scheduled for a device check from home on .  06-04-18 You may send your transmission at any time that day. If you have a wireless device, the transmission will be sent automatically. After your physician reviews your transmission, you will receive a postcard with your next transmission date.     Any Other Special Instructions Will Be Listed Below (If Applicable).

## 2018-05-08 DIAGNOSIS — M779 Enthesopathy, unspecified: Secondary | ICD-10-CM | POA: Diagnosis not present

## 2018-05-22 DIAGNOSIS — Z1339 Encounter for screening examination for other mental health and behavioral disorders: Secondary | ICD-10-CM | POA: Diagnosis not present

## 2018-05-22 DIAGNOSIS — Z13220 Encounter for screening for lipoid disorders: Secondary | ICD-10-CM | POA: Diagnosis not present

## 2018-05-22 DIAGNOSIS — Z6824 Body mass index (BMI) 24.0-24.9, adult: Secondary | ICD-10-CM | POA: Diagnosis not present

## 2018-05-22 DIAGNOSIS — Z0000 Encounter for general adult medical examination without abnormal findings: Secondary | ICD-10-CM | POA: Diagnosis not present

## 2018-05-22 DIAGNOSIS — M810 Age-related osteoporosis without current pathological fracture: Secondary | ICD-10-CM | POA: Diagnosis not present

## 2018-05-22 DIAGNOSIS — Z1331 Encounter for screening for depression: Secondary | ICD-10-CM | POA: Diagnosis not present

## 2018-05-22 DIAGNOSIS — Z01419 Encounter for gynecological examination (general) (routine) without abnormal findings: Secondary | ICD-10-CM | POA: Diagnosis not present

## 2018-05-23 DIAGNOSIS — Z01419 Encounter for gynecological examination (general) (routine) without abnormal findings: Secondary | ICD-10-CM | POA: Diagnosis not present

## 2018-06-04 ENCOUNTER — Ambulatory Visit (INDEPENDENT_AMBULATORY_CARE_PROVIDER_SITE_OTHER): Payer: BLUE CROSS/BLUE SHIELD | Admitting: *Deleted

## 2018-06-04 DIAGNOSIS — I442 Atrioventricular block, complete: Secondary | ICD-10-CM

## 2018-06-05 NOTE — Progress Notes (Signed)
Remote pacemaker transmission.   

## 2018-06-07 ENCOUNTER — Encounter: Payer: Self-pay | Admitting: Cardiology

## 2018-06-19 ENCOUNTER — Telehealth: Payer: Self-pay

## 2018-06-19 NOTE — Telephone Encounter (Signed)
Called patient to complete annual survey.

## 2018-06-29 LAB — CUP PACEART REMOTE DEVICE CHECK
Battery Remaining Longevity: 56 mo
Brady Statistic AP VS Percent: 0.31 %
Brady Statistic AS VP Percent: 45.19 %
Brady Statistic AS VS Percent: 0.42 %
Brady Statistic RA Percent Paced: 54.36 %
Implantable Lead Implant Date: 20180729
Implantable Lead Location: 753859
Implantable Lead Location: 753860
Implantable Lead Model: 3830
Implantable Lead Model: 5076
Implantable Pulse Generator Implant Date: 20180729
Lead Channel Impedance Value: 361 Ohm
Lead Channel Impedance Value: 437 Ohm
Lead Channel Pacing Threshold Amplitude: 0.5 V
Lead Channel Pacing Threshold Pulse Width: 0.4 ms
Lead Channel Pacing Threshold Pulse Width: 0.4 ms
Lead Channel Sensing Intrinsic Amplitude: 1.875 mV
Lead Channel Sensing Intrinsic Amplitude: 1.875 mV
Lead Channel Sensing Intrinsic Amplitude: 3.125 mV
Lead Channel Sensing Intrinsic Amplitude: 3.125 mV
Lead Channel Setting Sensing Sensitivity: 0.9 mV
MDC IDC LEAD IMPLANT DT: 20180729
MDC IDC MSMT BATTERY VOLTAGE: 2.97 V
MDC IDC MSMT LEADCHNL RV IMPEDANCE VALUE: 285 Ohm
MDC IDC MSMT LEADCHNL RV IMPEDANCE VALUE: 418 Ohm
MDC IDC MSMT LEADCHNL RV PACING THRESHOLD AMPLITUDE: 2.5 V
MDC IDC SESS DTM: 20191014032013
MDC IDC SET LEADCHNL RA PACING AMPLITUDE: 2 V
MDC IDC SET LEADCHNL RV PACING AMPLITUDE: 3.5 V
MDC IDC SET LEADCHNL RV PACING PULSEWIDTH: 1 ms
MDC IDC STAT BRADY AP VP PERCENT: 54.09 %
MDC IDC STAT BRADY RV PERCENT PACED: 99.27 %

## 2018-07-13 DIAGNOSIS — J329 Chronic sinusitis, unspecified: Secondary | ICD-10-CM | POA: Diagnosis not present

## 2018-07-13 DIAGNOSIS — Z6824 Body mass index (BMI) 24.0-24.9, adult: Secondary | ICD-10-CM | POA: Diagnosis not present

## 2018-08-02 DIAGNOSIS — Z803 Family history of malignant neoplasm of breast: Secondary | ICD-10-CM | POA: Diagnosis not present

## 2018-08-02 DIAGNOSIS — Z1231 Encounter for screening mammogram for malignant neoplasm of breast: Secondary | ICD-10-CM | POA: Diagnosis not present

## 2018-08-10 DIAGNOSIS — Z23 Encounter for immunization: Secondary | ICD-10-CM | POA: Diagnosis not present

## 2018-08-27 DIAGNOSIS — D225 Melanocytic nevi of trunk: Secondary | ICD-10-CM | POA: Diagnosis not present

## 2018-08-27 DIAGNOSIS — D2239 Melanocytic nevi of other parts of face: Secondary | ICD-10-CM | POA: Diagnosis not present

## 2018-08-27 DIAGNOSIS — L814 Other melanin hyperpigmentation: Secondary | ICD-10-CM | POA: Diagnosis not present

## 2018-08-27 DIAGNOSIS — L821 Other seborrheic keratosis: Secondary | ICD-10-CM | POA: Diagnosis not present

## 2018-08-27 DIAGNOSIS — D485 Neoplasm of uncertain behavior of skin: Secondary | ICD-10-CM | POA: Diagnosis not present

## 2018-08-29 DIAGNOSIS — D485 Neoplasm of uncertain behavior of skin: Secondary | ICD-10-CM | POA: Diagnosis not present

## 2018-09-03 ENCOUNTER — Ambulatory Visit: Payer: BLUE CROSS/BLUE SHIELD

## 2018-09-05 ENCOUNTER — Telehealth: Payer: Self-pay

## 2018-09-05 NOTE — Telephone Encounter (Signed)
Left message for patient to remind of missed remote transmission.  

## 2018-09-06 LAB — CUP PACEART REMOTE DEVICE CHECK
Battery Voltage: 2.96 V
Brady Statistic AP VP Percent: 40.71 %
Brady Statistic AP VS Percent: 0.22 %
Brady Statistic AS VP Percent: 58.98 %
Brady Statistic RA Percent Paced: 40.89 %
Date Time Interrogation Session: 20200116004829
Implantable Lead Model: 5076
Lead Channel Impedance Value: 285 Ohm
Lead Channel Impedance Value: 361 Ohm
Lead Channel Impedance Value: 418 Ohm
Lead Channel Pacing Threshold Amplitude: 0.5 V
Lead Channel Pacing Threshold Amplitude: 2.25 V
Lead Channel Pacing Threshold Pulse Width: 0.4 ms
Lead Channel Sensing Intrinsic Amplitude: 2.125 mV
Lead Channel Sensing Intrinsic Amplitude: 2.5 mV
Lead Channel Sensing Intrinsic Amplitude: 2.5 mV
Lead Channel Setting Pacing Amplitude: 3.5 V
Lead Channel Setting Pacing Pulse Width: 1 ms
MDC IDC LEAD IMPLANT DT: 20180729
MDC IDC LEAD IMPLANT DT: 20180729
MDC IDC LEAD LOCATION: 753859
MDC IDC LEAD LOCATION: 753860
MDC IDC MSMT BATTERY REMAINING LONGEVITY: 56 mo
MDC IDC MSMT LEADCHNL RA PACING THRESHOLD PULSEWIDTH: 0.4 ms
MDC IDC MSMT LEADCHNL RA SENSING INTR AMPL: 2.125 mV
MDC IDC MSMT LEADCHNL RV IMPEDANCE VALUE: 456 Ohm
MDC IDC PG IMPLANT DT: 20180729
MDC IDC SET LEADCHNL RA PACING AMPLITUDE: 2 V
MDC IDC SET LEADCHNL RV SENSING SENSITIVITY: 0.9 mV
MDC IDC STAT BRADY AS VS PERCENT: 0.09 %
MDC IDC STAT BRADY RV PERCENT PACED: 99.69 %

## 2018-10-10 IMAGING — CR DG CHEST 2V
2 series · 2 of 2 positions shown · non-contrast
Comparison: Portable chest x-ray March 17, 2017

CLINICAL DATA: Status post pacemaker insertion

EXAM:
CHEST  2 VIEW

[chest lat]
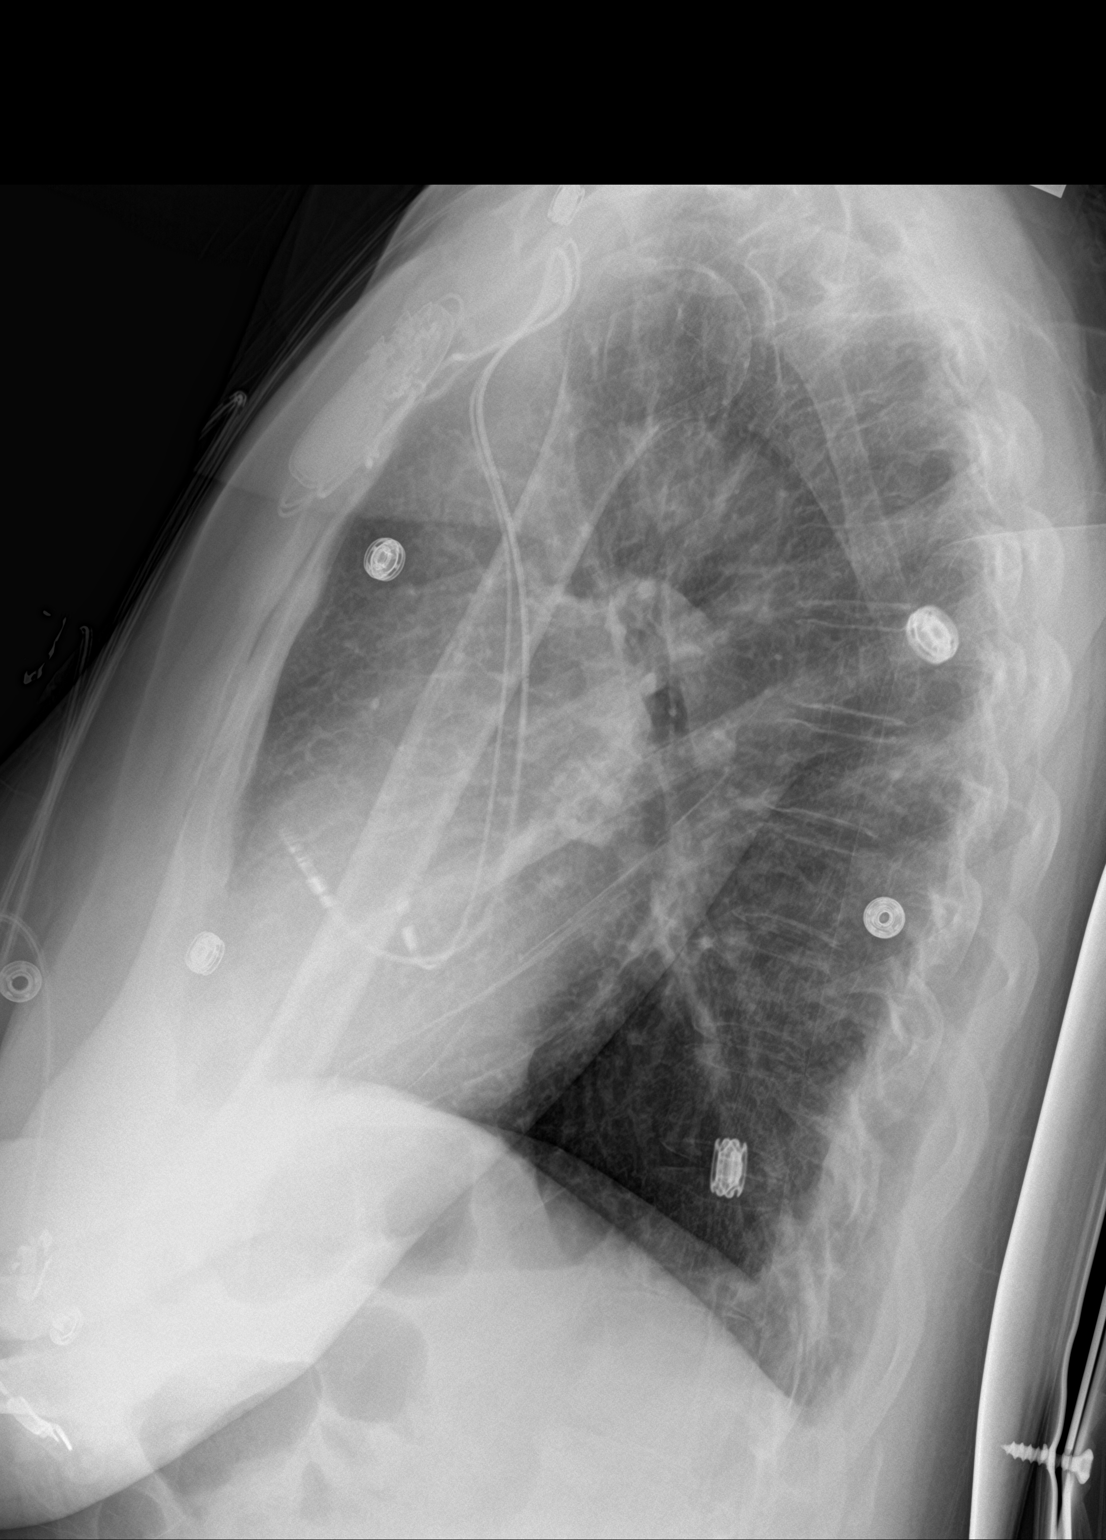

[chest ap]
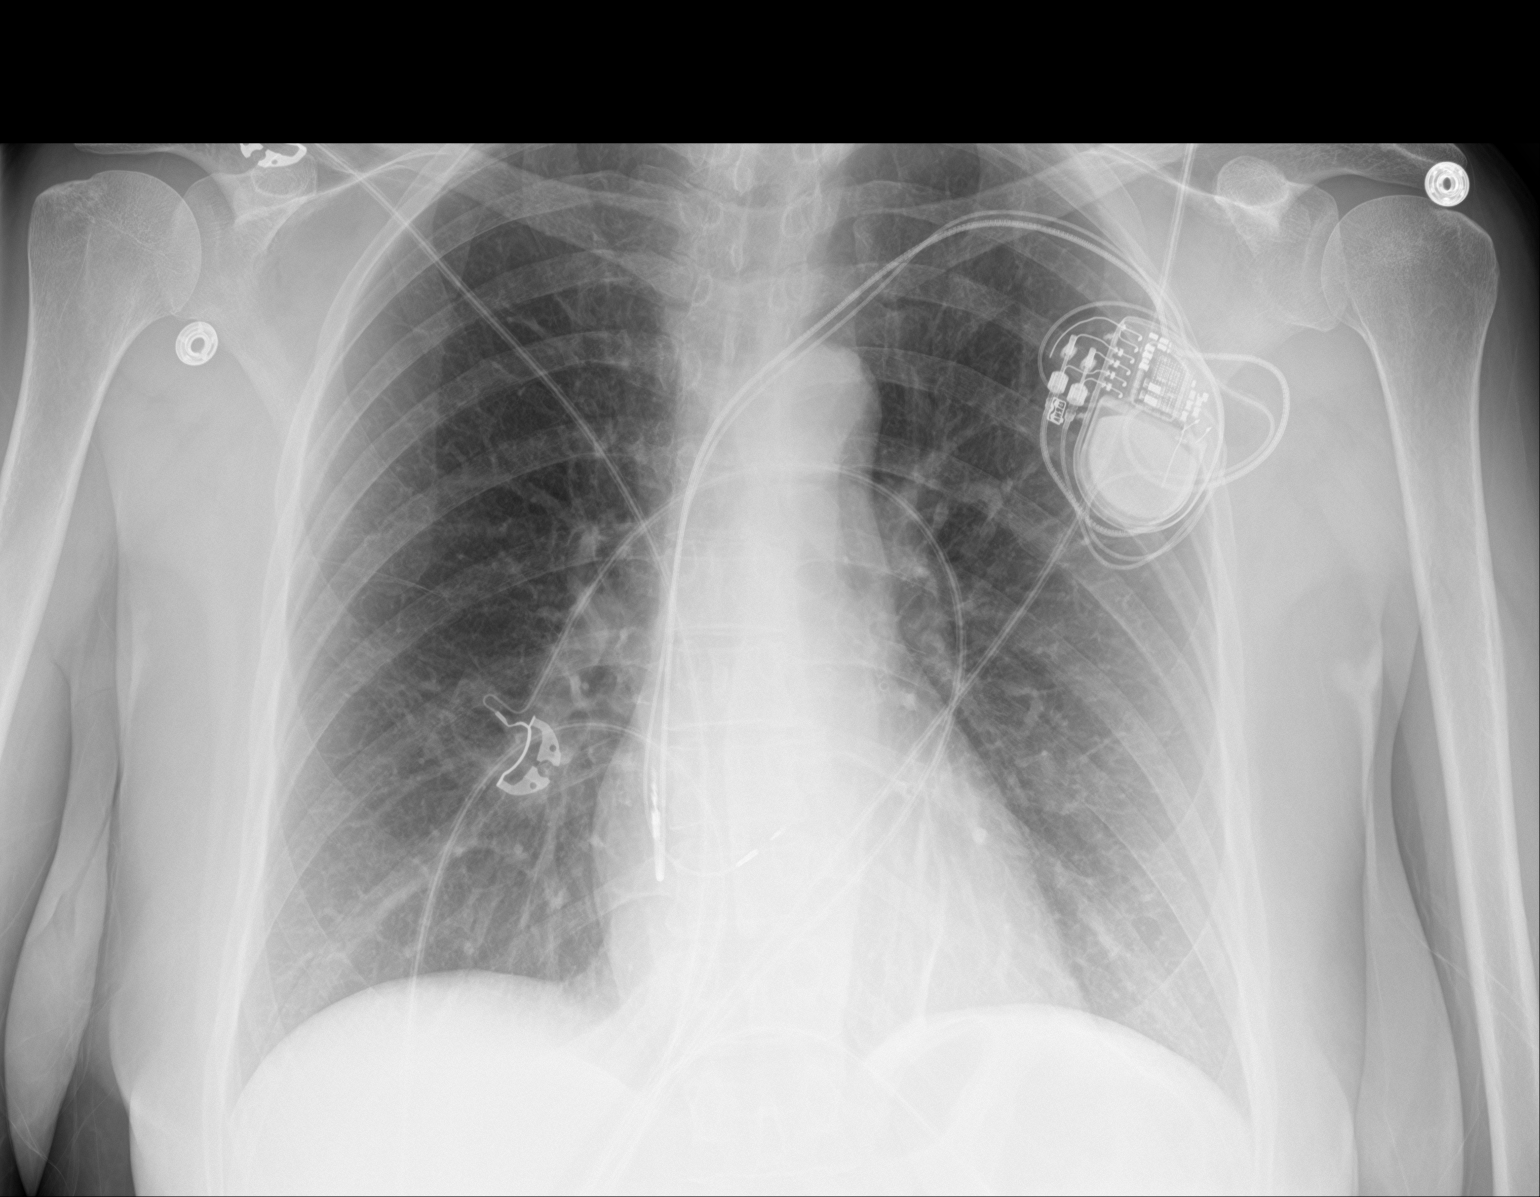

[2 of 2 positions shown; findings below may reference images not displayed]

FINDINGS: The lungs are well-expanded. The heart and pulmonary vascularity are
normal. There is no pneumothorax or pleural effusion. The
mediastinum is normal in width. The pacemaker electrodes are in
reasonable position. The bony thorax exhibits no acute abnormality.
IMPRESSION: There is no postprocedure complication following permanent pacemaker
placement.

## 2018-11-06 DIAGNOSIS — D485 Neoplasm of uncertain behavior of skin: Secondary | ICD-10-CM | POA: Diagnosis not present

## 2018-12-03 ENCOUNTER — Other Ambulatory Visit: Payer: Self-pay

## 2018-12-03 ENCOUNTER — Ambulatory Visit (INDEPENDENT_AMBULATORY_CARE_PROVIDER_SITE_OTHER): Payer: BLUE CROSS/BLUE SHIELD | Admitting: *Deleted

## 2018-12-03 DIAGNOSIS — I442 Atrioventricular block, complete: Secondary | ICD-10-CM

## 2018-12-03 DIAGNOSIS — R0602 Shortness of breath: Secondary | ICD-10-CM

## 2018-12-04 ENCOUNTER — Telehealth: Payer: Self-pay

## 2018-12-04 LAB — CUP PACEART REMOTE DEVICE CHECK
Battery Remaining Longevity: 55 mo
Battery Voltage: 2.96 V
Brady Statistic AP VP Percent: 41.06 %
Brady Statistic AP VS Percent: 0.1 %
Brady Statistic AS VP Percent: 58.81 %
Brady Statistic AS VS Percent: 0.03 %
Brady Statistic RA Percent Paced: 41.12 %
Brady Statistic RV Percent Paced: 99.87 %
Date Time Interrogation Session: 20200413012157
Implantable Lead Implant Date: 20180729
Implantable Lead Implant Date: 20180729
Implantable Lead Location: 753859
Implantable Lead Location: 753860
Implantable Lead Model: 3830
Implantable Lead Model: 5076
Implantable Pulse Generator Implant Date: 20180729
Lead Channel Impedance Value: 285 Ohm
Lead Channel Impedance Value: 361 Ohm
Lead Channel Impedance Value: 437 Ohm
Lead Channel Impedance Value: 494 Ohm
Lead Channel Pacing Threshold Amplitude: 0.5 V
Lead Channel Pacing Threshold Amplitude: 2.5 V
Lead Channel Pacing Threshold Pulse Width: 0.4 ms
Lead Channel Pacing Threshold Pulse Width: 0.4 ms
Lead Channel Sensing Intrinsic Amplitude: 1.375 mV
Lead Channel Sensing Intrinsic Amplitude: 1.375 mV
Lead Channel Sensing Intrinsic Amplitude: 2 mV
Lead Channel Sensing Intrinsic Amplitude: 2 mV
Lead Channel Setting Pacing Amplitude: 2 V
Lead Channel Setting Pacing Amplitude: 3.5 V
Lead Channel Setting Pacing Pulse Width: 1 ms
Lead Channel Setting Sensing Sensitivity: 0.9 mV

## 2018-12-04 NOTE — Telephone Encounter (Signed)
Left message regarding upcoming appt on 12/07/18.

## 2018-12-07 ENCOUNTER — Other Ambulatory Visit: Payer: Self-pay

## 2018-12-07 ENCOUNTER — Telehealth (INDEPENDENT_AMBULATORY_CARE_PROVIDER_SITE_OTHER): Payer: BLUE CROSS/BLUE SHIELD | Admitting: Internal Medicine

## 2018-12-07 DIAGNOSIS — I442 Atrioventricular block, complete: Secondary | ICD-10-CM

## 2018-12-07 DIAGNOSIS — E663 Overweight: Secondary | ICD-10-CM | POA: Diagnosis not present

## 2018-12-07 NOTE — Progress Notes (Signed)
Electrophysiology TeleHealth Note   Due to national recommendations of social distancing due to [place] 19, an audio telehealth visit is felt to be most appropriate for this patient at this time.  Verbal consent obtained today.  She reports very poor limited cell reception at her home and no internet.  She states that due to technology limitation, she can only do a phone call.   Date:  12/07/2018   ID:  Erika Reyes, DOB 11-Oct-1955, MRN [id number]  Location: patient's home  Provider location: [address], [place]  Evaluation Performed: Follow-up visit  PCP:  Ronita Hipps, MD  Electrophysiologist:  Dr Rayann Heman  Chief Complaint:  SOB  History of Present Illness:    Erika Reyes is a 63 y.o. female who presents via audio/video conferencing for a telehealth visit today.  Since last being seen in our clinic, the patient reports doing very well.  Today, she denies symptoms of palpitations, chest pain, shortness of breath,  lower extremity edema, dizziness, presyncope, or syncope.  The patient is otherwise without complaint today.  The patient denies symptoms of fevers, chills, cough, or new SOB worrisome for COVID 19.  Past Medical History:  Diagnosis Date  . Complete heart block (HCC)    a. s/p MDT dual chamber (His Bundle) PPM - Dr Rayann Heman  . Meningioma ([place]) 01/29/2014  . Osteoporosis of lumbar spine     Past Surgical History:  Procedure Laterality Date  . PACEMAKER IMPLANT N/A 03/19/2017   Procedure: Pacemaker Implant;  Surgeon: Thompson Grayer, MD;  Location: [facility];  Service: Cardiovascular;  Laterality: N/A;    Current Outpatient Medications  Medication Sig Dispense Refill  . acetaminophen (TYLENOL) 500 MG tablet Take 500 mg by mouth every 6 (six) hours as needed (pain).     Marland Kitchen alendronate (FOSAMAX) 35 MG tablet Take 35 mg by mouth every 7 (seven) days.   2  . Cascara Sagrada-Senna-Nat Lax (BIOHM COLON CLEANSER) CAPS Take 2 capsules by mouth 2 (two)  times daily.     Marland Kitchen doxycycline (MONODOX) 100 MG capsule Take 100 mg by mouth 2 (two) times daily. Take for 7 days  0  . ibuprofen (ADVIL,MOTRIN) 200 MG tablet Take 200 mg by mouth every 6 (six) hours as needed (pain).     . Multiple Vitamins-Minerals (MULTIVITAMIN ADULT PO) Take 2 tablets by mouth every morning.     [name] 3-6-9 Fatty Acids (OMEGA-3-6-9 PO) Take 2 capsules by mouth every evening.     . Probiotic Product (PROBIOTIC PO) Take 2 capsules by mouth every evening.      No current facility-administered medications for this visit.     Allergies:   Celecoxib and Penicillin g   Social History:  The patient  reports that she has never smoked. She has never used smokeless tobacco. She reports that she does not drink alcohol or use drugs.   Family History:  The patient's family history includes Cancer in her mother; Dementia in her mother; Other in her father.   ROS:  Please see the history of present illness.   All other systems are personally reviewed and negative.    Exam:    Vital Signs:  There were no vitals taken for this visit.  Well sounding   Labs/Other Tests and Data Reviewed:    Recent Labs: No results found for requested labs within last 8760 hours.   Wt Readings from Last 3 Encounters:  05/03/18 144 lb 12.8 oz (65.7  kg)  11/21/17 156 lb 6.4 oz (70.9 kg)  06/19/17 139 lb 6.4 oz (63.2 kg)     Other studies personally reviewed: Additional studies/ records that were reviewed today include: my prior implant notes, EP NP notes  Review of the above records today demonstrates: as above Prior radiographs: 05/10/17- CXR stable leads, no ptx    Last device remote is reviewed from [place] PDF dated 12/06/2018 which reveals normal device function, no arrhythmias    ASSESSMENT & PLAN:    1. Complete heart block Normal device function by remote 11/2018 She is programmed 3.5V @1msec  for selective capture.  We might consider reducing output in the future for battery  longevity  2. Overweight Improved with lifestyle changes  3. COVID 19 screen The patient denies symptoms of COVID 19 at this time.  The importance of social distancing was discussed today.  Follow-up:  12 months with EP NP Next remote: 02/2019  Current medicines are reviewed at length with the patient today.   The patient does not have concerns regarding her medicines.  The following changes were made today:  none  Labs/ tests ordered today include:  No orders of the defined types were placed in this encounter.    Patient Risk:  after full review of this patients clinical status, I feel that they are at moderate risk at this time.  Today, I have spent 15 minutes with the patient with telehealth technology discussing lifestyle modification .    Army Fossa, MD  12/07/2018 10:36 AM     [facility] [address] [place] [address] [phone] (office) [phone] (fax)

## 2018-12-07 NOTE — Telephone Encounter (Signed)
New Message:    Pt said she had  Virtual Tele phone appt today at 10:20. She had not heard anything.

## 2018-12-07 NOTE — Telephone Encounter (Signed)
No action needed

## 2018-12-10 ENCOUNTER — Encounter: Payer: BLUE CROSS/BLUE SHIELD | Admitting: Internal Medicine

## 2018-12-13 NOTE — Progress Notes (Signed)
Remote pacemaker transmission.   

## 2019-03-04 ENCOUNTER — Ambulatory Visit (INDEPENDENT_AMBULATORY_CARE_PROVIDER_SITE_OTHER): Payer: BC Managed Care – PPO | Admitting: *Deleted

## 2019-03-04 DIAGNOSIS — I442 Atrioventricular block, complete: Secondary | ICD-10-CM

## 2019-03-04 LAB — CUP PACEART REMOTE DEVICE CHECK
Battery Remaining Longevity: 50 mo
Battery Voltage: 2.95 V
Brady Statistic AP VP Percent: 46.74 %
Brady Statistic AP VS Percent: 0.2 %
Brady Statistic AS VP Percent: 53.01 %
Brady Statistic AS VS Percent: 0.06 %
Brady Statistic RA Percent Paced: 46.9 %
Brady Statistic RV Percent Paced: 99.74 %
Date Time Interrogation Session: 20200713032016
Implantable Lead Implant Date: 20180729
Implantable Lead Implant Date: 20180729
Implantable Lead Location: 753859
Implantable Lead Location: 753860
Implantable Lead Model: 3830
Implantable Lead Model: 5076
Implantable Pulse Generator Implant Date: 20180729
Lead Channel Impedance Value: 285 Ohm
Lead Channel Impedance Value: 361 Ohm
Lead Channel Impedance Value: 418 Ohm
Lead Channel Impedance Value: 475 Ohm
Lead Channel Pacing Threshold Amplitude: 0.5 V
Lead Channel Pacing Threshold Amplitude: 2.5 V
Lead Channel Pacing Threshold Pulse Width: 0.4 ms
Lead Channel Pacing Threshold Pulse Width: 0.4 ms
Lead Channel Sensing Intrinsic Amplitude: 1.375 mV
Lead Channel Sensing Intrinsic Amplitude: 1.375 mV
Lead Channel Sensing Intrinsic Amplitude: 4.125 mV
Lead Channel Sensing Intrinsic Amplitude: 4.125 mV
Lead Channel Setting Pacing Amplitude: 2 V
Lead Channel Setting Pacing Amplitude: 3.5 V
Lead Channel Setting Pacing Pulse Width: 1 ms
Lead Channel Setting Sensing Sensitivity: 0.9 mV

## 2019-03-08 DIAGNOSIS — H52203 Unspecified astigmatism, bilateral: Secondary | ICD-10-CM | POA: Diagnosis not present

## 2019-03-08 DIAGNOSIS — H2513 Age-related nuclear cataract, bilateral: Secondary | ICD-10-CM | POA: Diagnosis not present

## 2019-03-08 DIAGNOSIS — H1859 Other hereditary corneal dystrophies: Secondary | ICD-10-CM | POA: Diagnosis not present

## 2019-03-14 NOTE — Progress Notes (Signed)
Remote pacemaker transmission.   

## 2019-06-04 ENCOUNTER — Ambulatory Visit (INDEPENDENT_AMBULATORY_CARE_PROVIDER_SITE_OTHER): Payer: BC Managed Care – PPO | Admitting: *Deleted

## 2019-06-04 DIAGNOSIS — I442 Atrioventricular block, complete: Secondary | ICD-10-CM | POA: Diagnosis not present

## 2019-06-04 LAB — CUP PACEART REMOTE DEVICE CHECK
Battery Remaining Longevity: 45 mo
Battery Voltage: 2.95 V
Brady Statistic AP VP Percent: 54.15 %
Brady Statistic AP VS Percent: 0.21 %
Brady Statistic AS VP Percent: 45.58 %
Brady Statistic AS VS Percent: 0.06 %
Brady Statistic RA Percent Paced: 54.36 %
Brady Statistic RV Percent Paced: 99.73 %
Date Time Interrogation Session: 20201013001023
Implantable Lead Implant Date: 20180729
Implantable Lead Implant Date: 20180729
Implantable Lead Location: 753859
Implantable Lead Location: 753860
Implantable Lead Model: 3830
Implantable Lead Model: 5076
Implantable Pulse Generator Implant Date: 20180729
Lead Channel Impedance Value: 285 Ohm
Lead Channel Impedance Value: 361 Ohm
Lead Channel Impedance Value: 418 Ohm
Lead Channel Impedance Value: 475 Ohm
Lead Channel Pacing Threshold Amplitude: 0.375 V
Lead Channel Pacing Threshold Amplitude: 2.5 V
Lead Channel Pacing Threshold Pulse Width: 0.4 ms
Lead Channel Pacing Threshold Pulse Width: 0.4 ms
Lead Channel Sensing Intrinsic Amplitude: 2.375 mV
Lead Channel Sensing Intrinsic Amplitude: 2.375 mV
Lead Channel Sensing Intrinsic Amplitude: 2.875 mV
Lead Channel Sensing Intrinsic Amplitude: 2.875 mV
Lead Channel Setting Pacing Amplitude: 2 V
Lead Channel Setting Pacing Amplitude: 3.5 V
Lead Channel Setting Pacing Pulse Width: 1 ms
Lead Channel Setting Sensing Sensitivity: 0.9 mV

## 2019-06-14 NOTE — Progress Notes (Signed)
Remote pacemaker transmission.   

## 2019-06-24 ENCOUNTER — Telehealth: Payer: Self-pay | Admitting: Internal Medicine

## 2019-06-24 NOTE — Telephone Encounter (Signed)
New Message:  Patient is experiencing a burning, stinging pain about two inches below the area where her pacemaker is. The pain started about a month ago as being infrequent, but it is becoming more regular, so she is getting more concerned.   The patient is currently at work until 3:30 today. She does not get good cell service at her house, so please try her home phone number after 3:30

## 2019-06-24 NOTE — Telephone Encounter (Signed)
Spoke with patient. Sensation started a month ago, occurred 2-3x/week at first, now 5-6x/week, including 2x this AM. Notes stinging, burning pain ~2" below PPM, pain radiates to left underarm area, lasts 15sec-70min. Doesn't occur with exercise, usually notices at rest. No other associated symptoms. PPM site appears well-healed, denies any signs/symptoms of infection.  Advised to send a PPM transmission through her app this evening when she returns home. Plan to review tomorrow. Pt in agreement with plan, no additional questions at this time.

## 2019-06-25 NOTE — Telephone Encounter (Signed)
Manual transmission received. Normal device function. No episodes. AP 51.8%, VP 98.9%. Lead trends stable, RV (His) threshold "high", autocapture on monitor only with output fixed at 3.5V @ 1.45ms.  Spoke with patient. Reassured her that PPM function is stable. Advised that I will forward message to Dr. Rayann Heman for review. Also encouraged pt to discuss with PCP. Pt verbalizes understanding and agreement with plan.

## 2019-06-30 NOTE — Telephone Encounter (Signed)
No new recommendations

## 2019-08-05 DIAGNOSIS — Z1231 Encounter for screening mammogram for malignant neoplasm of breast: Secondary | ICD-10-CM | POA: Diagnosis not present

## 2019-08-05 DIAGNOSIS — Z803 Family history of malignant neoplasm of breast: Secondary | ICD-10-CM | POA: Diagnosis not present

## 2019-08-08 DIAGNOSIS — Z0000 Encounter for general adult medical examination without abnormal findings: Secondary | ICD-10-CM | POA: Diagnosis not present

## 2019-08-08 DIAGNOSIS — Z6824 Body mass index (BMI) 24.0-24.9, adult: Secondary | ICD-10-CM | POA: Diagnosis not present

## 2019-08-08 DIAGNOSIS — Z79899 Other long term (current) drug therapy: Secondary | ICD-10-CM | POA: Diagnosis not present

## 2019-08-08 DIAGNOSIS — Z23 Encounter for immunization: Secondary | ICD-10-CM | POA: Diagnosis not present

## 2019-08-08 DIAGNOSIS — Z1331 Encounter for screening for depression: Secondary | ICD-10-CM | POA: Diagnosis not present

## 2019-09-02 DIAGNOSIS — D1801 Hemangioma of skin and subcutaneous tissue: Secondary | ICD-10-CM | POA: Diagnosis not present

## 2019-09-02 DIAGNOSIS — D485 Neoplasm of uncertain behavior of skin: Secondary | ICD-10-CM | POA: Diagnosis not present

## 2019-09-02 DIAGNOSIS — D2239 Melanocytic nevi of other parts of face: Secondary | ICD-10-CM | POA: Diagnosis not present

## 2019-09-02 DIAGNOSIS — D225 Melanocytic nevi of trunk: Secondary | ICD-10-CM | POA: Diagnosis not present

## 2019-09-03 ENCOUNTER — Ambulatory Visit (INDEPENDENT_AMBULATORY_CARE_PROVIDER_SITE_OTHER): Payer: BC Managed Care – PPO | Admitting: *Deleted

## 2019-09-03 DIAGNOSIS — I442 Atrioventricular block, complete: Secondary | ICD-10-CM

## 2019-09-03 LAB — CUP PACEART REMOTE DEVICE CHECK
Battery Remaining Longevity: 40 mo
Battery Voltage: 2.94 V
Brady Statistic AP VP Percent: 42.39 %
Brady Statistic AP VS Percent: 0.38 %
Brady Statistic AS VP Percent: 57.11 %
Brady Statistic AS VS Percent: 0.12 %
Brady Statistic RA Percent Paced: 42.74 %
Brady Statistic RV Percent Paced: 99.5 %
Date Time Interrogation Session: 20210111233646
Implantable Lead Implant Date: 20180729
Implantable Lead Implant Date: 20180729
Implantable Lead Location: 753859
Implantable Lead Location: 753860
Implantable Lead Model: 3830
Implantable Lead Model: 5076
Implantable Pulse Generator Implant Date: 20180729
Lead Channel Impedance Value: 285 Ohm
Lead Channel Impedance Value: 399 Ohm
Lead Channel Impedance Value: 456 Ohm
Lead Channel Impedance Value: 475 Ohm
Lead Channel Pacing Threshold Amplitude: 0.375 V
Lead Channel Pacing Threshold Amplitude: 2.5 V
Lead Channel Pacing Threshold Pulse Width: 0.4 ms
Lead Channel Pacing Threshold Pulse Width: 0.4 ms
Lead Channel Sensing Intrinsic Amplitude: 1.375 mV
Lead Channel Sensing Intrinsic Amplitude: 1.375 mV
Lead Channel Sensing Intrinsic Amplitude: 3.375 mV
Lead Channel Sensing Intrinsic Amplitude: 3.375 mV
Lead Channel Setting Pacing Amplitude: 2 V
Lead Channel Setting Pacing Amplitude: 3.5 V
Lead Channel Setting Pacing Pulse Width: 1 ms
Lead Channel Setting Sensing Sensitivity: 0.9 mV

## 2019-12-03 ENCOUNTER — Ambulatory Visit (INDEPENDENT_AMBULATORY_CARE_PROVIDER_SITE_OTHER): Payer: BC Managed Care – PPO | Admitting: *Deleted

## 2019-12-03 DIAGNOSIS — I442 Atrioventricular block, complete: Secondary | ICD-10-CM

## 2019-12-03 LAB — CUP PACEART REMOTE DEVICE CHECK
Battery Remaining Longevity: 34 mo
Battery Voltage: 2.93 V
Brady Statistic AP VP Percent: 35.95 %
Brady Statistic AP VS Percent: 0.44 %
Brady Statistic AS VP Percent: 63.34 %
Brady Statistic AS VS Percent: 0.27 %
Brady Statistic RA Percent Paced: 36.35 %
Brady Statistic RV Percent Paced: 99.29 %
Date Time Interrogation Session: 20210412210754
Implantable Lead Implant Date: 20180729
Implantable Lead Implant Date: 20180729
Implantable Lead Location: 753859
Implantable Lead Location: 753860
Implantable Lead Model: 3830
Implantable Lead Model: 5076
Implantable Pulse Generator Implant Date: 20180729
Lead Channel Impedance Value: 285 Ohm
Lead Channel Impedance Value: 361 Ohm
Lead Channel Impedance Value: 418 Ohm
Lead Channel Impedance Value: 475 Ohm
Lead Channel Pacing Threshold Amplitude: 0.5 V
Lead Channel Pacing Threshold Amplitude: 2 V
Lead Channel Pacing Threshold Pulse Width: 0.4 ms
Lead Channel Pacing Threshold Pulse Width: 0.4 ms
Lead Channel Sensing Intrinsic Amplitude: 1 mV
Lead Channel Sensing Intrinsic Amplitude: 1 mV
Lead Channel Sensing Intrinsic Amplitude: 2.625 mV
Lead Channel Sensing Intrinsic Amplitude: 2.625 mV
Lead Channel Setting Pacing Amplitude: 2 V
Lead Channel Setting Pacing Amplitude: 3.5 V
Lead Channel Setting Pacing Pulse Width: 1 ms
Lead Channel Setting Sensing Sensitivity: 0.9 mV

## 2019-12-04 NOTE — Progress Notes (Signed)
PPM Remote  

## 2019-12-29 NOTE — Progress Notes (Signed)
Cardiology Office Note Date:  12/30/2019  Patient ID:  Erika Reyes 1956-04-21, MRN [id number] PCP:  Ronita Hipps, MD  Cardiologist:  Dr. Domenic Polite Electrophysiologist: Dr. Rayann Heman   Chief Complaint:  annual visit  History of Present Illness: Erika Reyes is a 64 y.o. female with history of meningioma, post concussion sx with intermittent chronic motion/balance instability, CHB w/PPM   She comes in today to be seen for Dr. Rayann Heman, last seen by him via tele-health visit April 2020, she was doing well.  He noted programmed 3.5V @1msec  for selective capture.  We might consider reducing output in the future for battery longevity.  She feels very well. 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  She denies any kind of exertional intolerances.  She is very active.  NO SOB, DOE.  No dizzy spells, near syncope or syncope. About 4-5 weeks ago she had a slight discomfort just inferior to her PPM, lasted a couple moutes, no associated symptoms, no radiation, she was at rest. Not recurrent.   Device information: MDT dual chamber PPM, 03/19/17, RV lead in HIS position, Dr. Rayann Heman   Past Medical History:  Diagnosis Date  . Complete heart block (HCC)    a. s/p MDT dual chamber (His Bundle) PPM - Dr Rayann Heman  . Meningioma ([place]) 01/29/2014  . Osteoporosis of lumbar spine     Past Surgical History:  Procedure Laterality Date  . PACEMAKER IMPLANT N/A 03/19/2017   Procedure: Pacemaker Implant;  Surgeon: Thompson Grayer, MD;  Location: [facility];  Service: Cardiovascular;  Laterality: N/A;    Current Outpatient Medications  Medication Sig Dispense Refill  . acetaminophen (TYLENOL) 500 MG tablet Take 500 mg by mouth every 6 (six) hours as needed (pain).     Marland Kitchen alendronate (FOSAMAX) 35 MG tablet Take 35 mg by mouth every 7 (seven) days.   2  . Cascara Sagrada-Senna-Nat Lax (BIOHM COLON CLEANSER)  CAPS Take 2 capsules by mouth 2 (two) times daily.     Marland Kitchen ibuprofen (ADVIL,MOTRIN) 200 MG tablet Take 200 mg by mouth every 6 (six) hours as needed (pain).     . Multiple Vitamins-Minerals (MULTIVITAMIN ADULT PO) Take 2 tablets by mouth every morning.     [name] 3-6-9 Fatty Acids (OMEGA-3-6-9 PO) Take 2 capsules by mouth every evening.     . Probiotic Product (PROBIOTIC PO) Take 2 capsules by mouth every evening.      No current facility-administered medications for this visit.    Allergies:   Celecoxib and Penicillin g   Social History:  The patient  reports that she has never smoked. She has never used smokeless tobacco. She reports that she does not drink alcohol or use drugs.   Family History:  The patient's family history includes Cancer in her mother; Dementia in her mother; Other in her father.  ROS:  Please see the history of present illness.  All other systems are reviewed and otherwise negative.   PHYSICAL EXAM:  VS:  BP 132/68   Pulse 64   Ht 5\' 4"  (1.626 m)   Wt 145 lb (65.8 kg)   BMI 24.89 kg/m  BMI: Body mass index is 24.89 kg/m. Well nourished, well developed, in no acute distress  HEENT: normocephalic, atraumatic  Neck: no JVD, carotid bruits or masses Cardiac: RRR; no significant murmurs, no rubs, or gallops Lungs:  CTA b/l, no wheezing,  rhonchi or rales  Abd: soft, nontender MS: no deformity oratrophy Ext: no edema  Skin: warm and dry, no rash Neuro:  No gross deficits appreciated Psych: euthymic mood, full affect  PPM site is stable, no tethering, skin changes or discomfort to palpation   EKG:  Done today and reviewed by myself shows  AV paced/fusing, icRBBB, QRS measured 120-131ms Post programming changes is AP/VS, RBBB, QRS 114ms  PPM interrogation done today by industry and reviewed by myself:  Battery estimate initially 2.6years She is AS/VS 60's underlying A lead measurements are stable R waves 2.1-2.6 Selective capture looks between 3.5 and  4.0V LOC is 1.5-1.75V  She is programmed DDDR She has intact AV conduction today Dr. Caryl Comes is in office today, She was programmed MVP on, while here she was AP/VS or AS/VS He discussed with the patient thoughts to program MVP on to help increase battery life given she has conduction today, and what that meant He recommends MVP on, and she was programmed this way.   03/18/17: TTE Study Conclusions - Left ventricle: The cavity size was normal. Wall thickness was   normal. Systolic function was normal. The estimated ejection   fraction was in the range of 60% to 65%. Wall motion was normal;   there were no regional wall motion abnormalities. The study is   not technically sufficient to allow evaluation of LV diastolic   function. - Aortic valve: Mildly calcified annulus. Trileaflet; mildly   calcified leaflets. - Mitral valve: There was trivial regurgitation. - Right atrium: Central venous pressure (est): 15 mm Hg. - Atrial septum: No defect or patent foramen ovale was identified. - Tricuspid valve: There was mild regurgitation. - Pulmonary arteries: PA peak pressure: 39 mm Hg (S). - Pericardium, extracardiac: There was no pericardial effusion. Impressions: - Normal LV wall thickness with LVEF 60-65%. Indeterminate   diastolic function. Trivial mitral regurgitation. Mildly   calcified aortic annulus. Mild tricuspid regurgitation with PASP   estimated 39 mmHg.  Recent Labs: No results found for requested labs within last 8760 hours.  No results found for requested labs within last 8760 hours.   CrCl cannot be calculated (Patient's most recent lab result is older than the maximum 21 days allowed.).   Wt Readings from Last 3 Encounters:  12/30/19 145 lb (65.8 kg)  12/07/18 133 lb (60.3 kg)  05/03/18 144 lb 12.8 oz (65.7 kg)     Other studies reviewed: Additional studies/records reviewed today include: summarized above  ASSESSMENT AND PLAN:  1. PPM     As discussed above      Will have her back in a month to assess her pacing %   2. AFib     She had on 12/30/18 an episode of AF of nearly 5 minutes, none again     No changes given single brief episode    Disposition: as above  Current medicines are reviewed at length with the patient today.  The patient did not have any concerns regarding medicines.  Venetia Night, PA-C 12/30/2019 6:03 PM     [address] [address] [phone] (office)  [phone] (fax)

## 2019-12-30 ENCOUNTER — Other Ambulatory Visit: Payer: Self-pay

## 2019-12-30 ENCOUNTER — Ambulatory Visit (INDEPENDENT_AMBULATORY_CARE_PROVIDER_SITE_OTHER): Payer: BC Managed Care – PPO | Admitting: Physician Assistant

## 2019-12-30 VITALS — BP 132/68 | HR 64 | Ht 64.0 in | Wt 145.0 lb

## 2019-12-30 DIAGNOSIS — Z950 Presence of cardiac pacemaker: Secondary | ICD-10-CM | POA: Diagnosis not present

## 2019-12-30 DIAGNOSIS — I442 Atrioventricular block, complete: Secondary | ICD-10-CM

## 2019-12-30 NOTE — Patient Instructions (Addendum)
  Medication Instructions:  Your physician recommends that you continue on your current medications as directed. Please refer to the Current Medication list given to you today.  *If you need a refill on your cardiac medications before your next appointment, please call your pharmacy*   Lab Work: None ordered  If you have labs (blood work) drawn today and your tests are completely normal, you will receive your results only by: Marland Kitchen MyChart Message (if you have MyChart) OR . A paper copy in the mail If you have any lab test that is abnormal or we need to change your treatment, we will call you to review the results.   Testing/Procedures: None ordered   Follow-Up: Follow up with Erika Standard, PA on 01/30/20 at 3:15 PM   Other Instructions

## 2020-01-30 ENCOUNTER — Ambulatory Visit (INDEPENDENT_AMBULATORY_CARE_PROVIDER_SITE_OTHER): Payer: BC Managed Care – PPO | Admitting: Physician Assistant

## 2020-01-30 ENCOUNTER — Other Ambulatory Visit: Payer: Self-pay

## 2020-01-30 ENCOUNTER — Encounter: Payer: Self-pay | Admitting: Physician Assistant

## 2020-01-30 VITALS — BP 118/70 | HR 78 | Ht 64.0 in | Wt 143.1 lb

## 2020-01-30 DIAGNOSIS — R079 Chest pain, unspecified: Secondary | ICD-10-CM | POA: Diagnosis not present

## 2020-01-30 DIAGNOSIS — I480 Paroxysmal atrial fibrillation: Secondary | ICD-10-CM

## 2020-01-30 DIAGNOSIS — Z950 Presence of cardiac pacemaker: Secondary | ICD-10-CM | POA: Diagnosis not present

## 2020-01-30 NOTE — Patient Instructions (Addendum)
Medication Instructions:  none *If you need a refill on your cardiac medications before your next appointment, please call your pharmacy*   Lab Work:   none If you have labs (blood work) drawn today and your tests are completely normal, you will receive your results only by: Marland Kitchen MyChart Message (if you have MyChart) OR . A paper copy in the mail If you have any lab test that is abnormal or we need to change your treatment, we will call you to review the results.   Testing/Procedures: none   Follow-Up: At [facility], you and your health needs are our priority.  As part of our continuing mission to provide you with exceptional heart care, we have created designated Provider Care Teams.  These Care Teams include your primary Cardiologist (physician) and Advanced Practice Providers (APPs -  Physician Assistants and Nurse Practitioners) who all work together to provide you with the care you need, when you need it.  We recommend signing up for the patient portal called "MyChart".  Sign up information is provided on this After Visit Summary.  MyChart is used to connect with patients for Virtual Visits (Telemedicine).  Patients are able to view lab/test results, encounter notes, upcoming appointments, etc.  Non-urgent messages can be sent to your provider as well.   To learn more about what you can do with MyChart, go to [email or web address].    Your next appointment:   1 year(s)  The format for your next appointment:   Either In Person or Virtual  Provider:   Dr Rayann Heman or APP   Other Instructions Remote monitoring is used to monitor your Pacemaker from home. This monitoring reduces the number of office visits required to check your device to one time per year. It allows [place] to keep an eye on the functioning of your device to ensure it is working properly. You are scheduled for a device check from home on 03/03/20. You may send your transmission at any time that day. If you have a  wireless device, the transmission will be sent automatically. After your physician reviews your transmission, you will receive a postcard with your next transmission date.

## 2020-01-30 NOTE — Progress Notes (Signed)
Cardiology Office Note Date:  01/30/2020  Patient ID:  Erika Reyes, Erika Reyes 10/11/1955, MRN [id number] PCP:  Erika Hipps, MD  Cardiologist:  Dr. Domenic Polite Electrophysiologist: Dr. Rayann Heman   Chief Complaint:  follow up, post programming change  History of Present Illness: Erika Reyes is a 64 y.o. female with history of meningioma, post concussion sx with intermittent chronic motion/balance instability, CHB w/PPM   She comes in today to be seen for Dr. Rayann Heman, last seen by him via tele-health visit April 2020, she was doing well.  He noted programmed 3.5V @1msec  for selective capture.  We might consider reducing output in the future for battery longevity.  I saw her 12/30/2019 She feels very well. 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  She denies any kind of exertional intolerances.  She is very active.  No SOB, DOE.  No dizzy spells, near syncope or syncope. About 4-5 weeks ago she had a slight discomfort just inferior to her PPM, lasted a couple minutes, no associated symptoms, no radiation, she was at rest. Not recurrent. She is AS/VS 60's underlying A lead measurements are stable R waves 2.1-2.6 Selective capture looks between 3.5 and 4.0V LOC is 1.5-1.75V Dr. Caryl Comes was in office, also checked thresholds, she had through the visit AV conduction and her device was programmed MVP Discussed with pt.   TODAY She is doing well.  She had one day that in general felt a little sluggish all day.  No dizzy spells, no palpitations, no near syncope or syncope.  She had one evening when seated watching TV a couple minutes of a waxing/waning central chest pressure, non radiating, reminded her of heartburn she felt when pregnant.  Not recurrent. She has had a mor fleeting type pain that feels muscular to her, starts left of low sternum and radiates lateral a few inches.  Lasted moments  No other  CP, no SOB, DOE.   Device information: MDT dual chamber PPM, 03/19/17, RV lead in HIS position, Dr. Rayann Heman   Past Medical History:  Diagnosis Date  . Complete heart block (HCC)    a. s/p MDT dual chamber (His Bundle) PPM - Dr Rayann Heman  . Meningioma ([place]) 01/29/2014  . Osteoporosis of lumbar spine     Past Surgical History:  Procedure Laterality Date  . PACEMAKER IMPLANT N/A 03/19/2017   Procedure: Pacemaker Implant;  Surgeon: Thompson Grayer, MD;  Location: [facility];  Service: Cardiovascular;  Laterality: N/A;    Current Outpatient Medications  Medication Sig Dispense Refill  . acetaminophen (TYLENOL) 500 MG tablet Take 500 mg by mouth every 6 (six) hours as needed (pain).     Marland Kitchen alendronate (FOSAMAX) 35 MG tablet Take 35 mg by mouth every 7 (seven) days.   2  . Cascara Sagrada-Senna-Nat Lax (BIOHM COLON CLEANSER) CAPS Take 2 capsules by mouth 2 (two) times daily.     Marland Kitchen ibuprofen (ADVIL,MOTRIN) 200 MG tablet Take 200 mg by mouth every 6 (six) hours as needed (pain).     . Multiple Vitamins-Minerals (MULTIVITAMIN ADULT PO) Take 2 tablets by mouth every morning.     [name] 3-6-9 Fatty Acids (OMEGA-3-6-9 PO) Take 2 capsules by mouth every evening.     . Probiotic Product (PROBIOTIC PO) Take 2 capsules by mouth every evening.      No current facility-administered medications for this visit.    Allergies:  Celecoxib and Penicillin g   Social History:  The patient  reports that she has never smoked. She has never used smokeless tobacco. She reports that she does not drink alcohol and does not use drugs.   Family History:  The patient's family history includes Cancer in her mother; Dementia in her mother; Other in her father.  ROS:  Please see the history of present illness.  All other systems are reviewed and otherwise negative.   PHYSICAL EXAM:  VS:  BP 118/70   Pulse 78   Ht 5\' 4"  (1.626 m)   Wt 143 lb 1.9 oz (64.9 kg)   SpO2 96%   BMI 24.57 kg/m  BMI: Body mass  index is 24.57 kg/m. Well nourished, well developed, in no acute distress  HEENT: normocephalic, atraumatic  Neck: no JVD, carotid bruits or masses Cardiac: RRR; no significant murmurs, no rubs, or gallops Lungs:  CTA b/l, no wheezing, rhonchi or rales  Abd: soft, nontender MS: no deformity or atrophy Ext: no edema  Skin: warm and dry, no rash Neuro:  No gross deficits appreciated Psych: euthymic mood, full affect  PPM site is stable, no tethering, skin changes, fluctation   EKG:  Done today and reviewed by myself AP, V sensed, RBBB, appears unchanged   PPM interrogation done today and reviewed by myself:  Battery longevity has improved to 3.7 years Lead measurements are stable AP 52.5% VP 33.3%   12/30/2019 Battery estimate initially 2.6years She is AS/VS 60's underlying A lead measurements are stable R waves 2.1-2.6 Selective capture looks between 3.5 and 4.0V LOC is 1.5-1.75V     03/18/17: TTE Study Conclusions - Left ventricle: The cavity size was normal. Wall thickness was   normal. Systolic function was normal. The estimated ejection   fraction was in the range of 60% to 65%. Wall motion was normal;   there were no regional wall motion abnormalities. The study is   not technically sufficient to allow evaluation of LV diastolic   function. - Aortic valve: Mildly calcified annulus. Trileaflet; mildly   calcified leaflets. - Mitral valve: There was trivial regurgitation. - Right atrium: Central venous pressure (est): 15 mm Hg. - Atrial septum: No defect or patent foramen ovale was identified. - Tricuspid valve: There was mild regurgitation. - Pulmonary arteries: PA peak pressure: 39 mm Hg (S). - Pericardium, extracardiac: There was no pericardial effusion. Impressions: - Normal LV wall thickness with LVEF 60-65%. Indeterminate   diastolic function. Trivial mitral regurgitation. Mildly   calcified aortic annulus. Mild tricuspid regurgitation with PASP    estimated 39 mmHg.  Recent Labs: No results found for requested labs within last 8760 hours.  No results found for requested labs within last 8760 hours.   CrCl cannot be calculated (Patient's most recent lab result is older than the maximum 21 days allowed.).   Wt Readings from Last 3 Encounters:  01/30/20 143 lb 1.9 oz (64.9 kg)  12/30/19 145 lb (65.8 kg)  12/07/18 133 lb (60.3 kg)     Other studies reviewed: Additional studies/records reviewed today include: summarized above  ASSESSMENT AND PLAN:  1. PPM     Stable findings     No programming changes made  She has MVP on with 33.3% VP She feels well    2. AFib     She had on 12/30/18 an episode of AF of nearly 5 minutes, none again     None further     Follow via her device  3. Atypical CP     Monitor for recurrence, trigger, patterns   Disposition: as above  Current medicines are reviewed at length with the patient today.  The patient did not have any concerns regarding medicines.  Erika Night, PA-C 01/30/2020 4:02 PM     [address] [place] [address] [phone] (office)  [phone] (fax)

## 2020-03-03 ENCOUNTER — Ambulatory Visit (INDEPENDENT_AMBULATORY_CARE_PROVIDER_SITE_OTHER): Payer: BC Managed Care – PPO | Admitting: *Deleted

## 2020-03-03 DIAGNOSIS — I442 Atrioventricular block, complete: Secondary | ICD-10-CM

## 2020-03-03 LAB — CUP PACEART REMOTE DEVICE CHECK
Battery Remaining Longevity: 36 mo
Battery Voltage: 2.93 V
Brady Statistic AP VP Percent: 22.78 %
Brady Statistic AP VS Percent: 26.59 %
Brady Statistic AS VP Percent: 30.75 %
Brady Statistic AS VS Percent: 19.88 %
Brady Statistic RA Percent Paced: 49.29 %
Brady Statistic RV Percent Paced: 53.53 %
Date Time Interrogation Session: 20210712202014
Implantable Lead Implant Date: 20180729
Implantable Lead Implant Date: 20180729
Implantable Lead Location: 753859
Implantable Lead Location: 753860
Implantable Lead Model: 3830
Implantable Lead Model: 5076
Implantable Pulse Generator Implant Date: 20180729
Lead Channel Impedance Value: 285 Ohm
Lead Channel Impedance Value: 380 Ohm
Lead Channel Impedance Value: 437 Ohm
Lead Channel Impedance Value: 475 Ohm
Lead Channel Pacing Threshold Amplitude: 0.5 V
Lead Channel Pacing Threshold Amplitude: 2.375 V
Lead Channel Pacing Threshold Pulse Width: 0.4 ms
Lead Channel Pacing Threshold Pulse Width: 0.4 ms
Lead Channel Sensing Intrinsic Amplitude: 0.5 mV
Lead Channel Sensing Intrinsic Amplitude: 0.5 mV
Lead Channel Sensing Intrinsic Amplitude: 2.75 mV
Lead Channel Sensing Intrinsic Amplitude: 2.75 mV
Lead Channel Setting Pacing Amplitude: 2 V
Lead Channel Setting Pacing Amplitude: 3.5 V
Lead Channel Setting Pacing Pulse Width: 1 ms
Lead Channel Setting Sensing Sensitivity: 0.9 mV

## 2020-03-04 NOTE — Progress Notes (Signed)
Remote pacemaker transmission.   

## 2020-04-29 DIAGNOSIS — L814 Other melanin hyperpigmentation: Secondary | ICD-10-CM | POA: Diagnosis not present

## 2020-04-29 DIAGNOSIS — M9901 Segmental and somatic dysfunction of cervical region: Secondary | ICD-10-CM | POA: Diagnosis not present

## 2020-04-29 DIAGNOSIS — M9903 Segmental and somatic dysfunction of lumbar region: Secondary | ICD-10-CM | POA: Diagnosis not present

## 2020-04-29 DIAGNOSIS — L820 Inflamed seborrheic keratosis: Secondary | ICD-10-CM | POA: Diagnosis not present

## 2020-04-29 DIAGNOSIS — M542 Cervicalgia: Secondary | ICD-10-CM | POA: Diagnosis not present

## 2020-04-29 DIAGNOSIS — M9902 Segmental and somatic dysfunction of thoracic region: Secondary | ICD-10-CM | POA: Diagnosis not present

## 2020-05-20 DIAGNOSIS — M9901 Segmental and somatic dysfunction of cervical region: Secondary | ICD-10-CM | POA: Diagnosis not present

## 2020-05-20 DIAGNOSIS — M9903 Segmental and somatic dysfunction of lumbar region: Secondary | ICD-10-CM | POA: Diagnosis not present

## 2020-05-20 DIAGNOSIS — M9902 Segmental and somatic dysfunction of thoracic region: Secondary | ICD-10-CM | POA: Diagnosis not present

## 2020-05-20 DIAGNOSIS — M542 Cervicalgia: Secondary | ICD-10-CM | POA: Diagnosis not present

## 2020-06-02 ENCOUNTER — Ambulatory Visit (INDEPENDENT_AMBULATORY_CARE_PROVIDER_SITE_OTHER): Payer: BC Managed Care – PPO

## 2020-06-02 DIAGNOSIS — I442 Atrioventricular block, complete: Secondary | ICD-10-CM

## 2020-06-02 LAB — CUP PACEART REMOTE DEVICE CHECK
Battery Remaining Longevity: 36 mo
Battery Voltage: 2.94 V
Brady Statistic AP VP Percent: 28.09 %
Brady Statistic AP VS Percent: 23.15 %
Brady Statistic AS VP Percent: 36.63 %
Brady Statistic AS VS Percent: 12.12 %
Brady Statistic RA Percent Paced: 51.09 %
Brady Statistic RV Percent Paced: 64.73 %
Date Time Interrogation Session: 20211011195140
Implantable Lead Implant Date: 20180729
Implantable Lead Implant Date: 20180729
Implantable Lead Location: 753859
Implantable Lead Location: 753860
Implantable Lead Model: 3830
Implantable Lead Model: 5076
Implantable Pulse Generator Implant Date: 20180729
Lead Channel Impedance Value: 266 Ohm
Lead Channel Impedance Value: 380 Ohm
Lead Channel Impedance Value: 418 Ohm
Lead Channel Impedance Value: 513 Ohm
Lead Channel Pacing Threshold Amplitude: 0.375 V
Lead Channel Pacing Threshold Amplitude: 2.375 V
Lead Channel Pacing Threshold Pulse Width: 0.4 ms
Lead Channel Pacing Threshold Pulse Width: 0.4 ms
Lead Channel Sensing Intrinsic Amplitude: 0.625 mV
Lead Channel Sensing Intrinsic Amplitude: 0.625 mV
Lead Channel Sensing Intrinsic Amplitude: 2.5 mV
Lead Channel Sensing Intrinsic Amplitude: 2.5 mV
Lead Channel Setting Pacing Amplitude: 2 V
Lead Channel Setting Pacing Amplitude: 3.5 V
Lead Channel Setting Pacing Pulse Width: 1 ms
Lead Channel Setting Sensing Sensitivity: 0.9 mV

## 2020-06-03 NOTE — Progress Notes (Signed)
Remote pacemaker transmission.   

## 2020-06-08 DIAGNOSIS — U071 COVID-19: Secondary | ICD-10-CM | POA: Diagnosis not present

## 2020-06-10 DIAGNOSIS — Z950 Presence of cardiac pacemaker: Secondary | ICD-10-CM | POA: Diagnosis not present

## 2020-06-10 DIAGNOSIS — R112 Nausea with vomiting, unspecified: Secondary | ICD-10-CM | POA: Diagnosis not present

## 2020-06-10 DIAGNOSIS — U071 COVID-19: Secondary | ICD-10-CM | POA: Diagnosis not present

## 2020-06-10 DIAGNOSIS — R509 Fever, unspecified: Secondary | ICD-10-CM | POA: Diagnosis not present

## 2020-08-10 DIAGNOSIS — M810 Age-related osteoporosis without current pathological fracture: Secondary | ICD-10-CM | POA: Diagnosis not present

## 2020-08-10 DIAGNOSIS — Z23 Encounter for immunization: Secondary | ICD-10-CM | POA: Diagnosis not present

## 2020-08-10 DIAGNOSIS — Z0000 Encounter for general adult medical examination without abnormal findings: Secondary | ICD-10-CM | POA: Diagnosis not present

## 2020-08-10 DIAGNOSIS — Z6824 Body mass index (BMI) 24.0-24.9, adult: Secondary | ICD-10-CM | POA: Diagnosis not present

## 2020-08-10 DIAGNOSIS — Z1231 Encounter for screening mammogram for malignant neoplasm of breast: Secondary | ICD-10-CM | POA: Diagnosis not present

## 2020-08-10 DIAGNOSIS — Z13220 Encounter for screening for lipoid disorders: Secondary | ICD-10-CM | POA: Diagnosis not present

## 2020-08-10 DIAGNOSIS — Z1331 Encounter for screening for depression: Secondary | ICD-10-CM | POA: Diagnosis not present

## 2020-08-13 DIAGNOSIS — M216X2 Other acquired deformities of left foot: Secondary | ICD-10-CM | POA: Diagnosis not present

## 2020-08-13 DIAGNOSIS — L851 Acquired keratosis [keratoderma] palmaris et plantaris: Secondary | ICD-10-CM | POA: Diagnosis not present

## 2020-08-13 DIAGNOSIS — M216X1 Other acquired deformities of right foot: Secondary | ICD-10-CM | POA: Diagnosis not present

## 2020-08-15 DIAGNOSIS — R072 Precordial pain: Secondary | ICD-10-CM | POA: Diagnosis not present

## 2020-08-15 DIAGNOSIS — Z950 Presence of cardiac pacemaker: Secondary | ICD-10-CM | POA: Diagnosis not present

## 2020-08-15 DIAGNOSIS — Z79899 Other long term (current) drug therapy: Secondary | ICD-10-CM | POA: Diagnosis not present

## 2020-08-15 DIAGNOSIS — R079 Chest pain, unspecified: Secondary | ICD-10-CM | POA: Diagnosis not present

## 2020-08-15 DIAGNOSIS — R0789 Other chest pain: Secondary | ICD-10-CM | POA: Diagnosis not present

## 2020-09-01 ENCOUNTER — Ambulatory Visit (INDEPENDENT_AMBULATORY_CARE_PROVIDER_SITE_OTHER): Payer: BC Managed Care – PPO

## 2020-09-01 DIAGNOSIS — D2239 Melanocytic nevi of other parts of face: Secondary | ICD-10-CM | POA: Diagnosis not present

## 2020-09-01 DIAGNOSIS — L814 Other melanin hyperpigmentation: Secondary | ICD-10-CM | POA: Diagnosis not present

## 2020-09-01 DIAGNOSIS — I442 Atrioventricular block, complete: Secondary | ICD-10-CM | POA: Diagnosis not present

## 2020-09-01 DIAGNOSIS — D225 Melanocytic nevi of trunk: Secondary | ICD-10-CM | POA: Diagnosis not present

## 2020-09-01 DIAGNOSIS — L821 Other seborrheic keratosis: Secondary | ICD-10-CM | POA: Diagnosis not present

## 2020-09-02 LAB — CUP PACEART REMOTE DEVICE CHECK
Battery Remaining Longevity: 21 mo
Battery Voltage: 2.91 V
Brady Statistic AP VP Percent: 21.09 %
Brady Statistic AP VS Percent: 13.28 %
Brady Statistic AS VP Percent: 53.67 %
Brady Statistic AS VS Percent: 11.95 %
Brady Statistic RA Percent Paced: 34.31 %
Brady Statistic RV Percent Paced: 74.77 %
Date Time Interrogation Session: 20220110225309
Implantable Lead Implant Date: 20180729
Implantable Lead Implant Date: 20180729
Implantable Lead Location: 753859
Implantable Lead Location: 753860
Implantable Lead Model: 3830
Implantable Lead Model: 5076
Implantable Pulse Generator Implant Date: 20180729
Lead Channel Impedance Value: 266 Ohm
Lead Channel Impedance Value: 285 Ohm
Lead Channel Impedance Value: 342 Ohm
Lead Channel Impedance Value: 475 Ohm
Lead Channel Pacing Threshold Amplitude: 0.5 V
Lead Channel Pacing Threshold Amplitude: 2.5 V
Lead Channel Pacing Threshold Pulse Width: 0.4 ms
Lead Channel Pacing Threshold Pulse Width: 0.4 ms
Lead Channel Sensing Intrinsic Amplitude: 1.875 mV
Lead Channel Sensing Intrinsic Amplitude: 1.875 mV
Lead Channel Sensing Intrinsic Amplitude: 2.75 mV
Lead Channel Sensing Intrinsic Amplitude: 2.75 mV
Lead Channel Setting Pacing Amplitude: 2 V
Lead Channel Setting Pacing Amplitude: 3.5 V
Lead Channel Setting Pacing Pulse Width: 1 ms
Lead Channel Setting Sensing Sensitivity: 0.9 mV

## 2020-09-11 ENCOUNTER — Ambulatory Visit (INDEPENDENT_AMBULATORY_CARE_PROVIDER_SITE_OTHER): Payer: BC Managed Care – PPO | Admitting: Internal Medicine

## 2020-09-11 ENCOUNTER — Other Ambulatory Visit: Payer: Self-pay

## 2020-09-11 ENCOUNTER — Encounter: Payer: Self-pay | Admitting: Internal Medicine

## 2020-09-11 VITALS — BP 134/78 | HR 73 | Ht 64.0 in | Wt 145.2 lb

## 2020-09-11 DIAGNOSIS — I442 Atrioventricular block, complete: Secondary | ICD-10-CM

## 2020-09-11 DIAGNOSIS — I480 Paroxysmal atrial fibrillation: Secondary | ICD-10-CM | POA: Diagnosis not present

## 2020-09-11 LAB — CUP PACEART INCLINIC DEVICE CHECK
Battery Remaining Longevity: 20 mo
Battery Voltage: 2.91 V
Brady Statistic AP VP Percent: 24.66 %
Brady Statistic AP VS Percent: 18.55 %
Brady Statistic AS VP Percent: 44.2 %
Brady Statistic AS VS Percent: 12.59 %
Brady Statistic RA Percent Paced: 43.11 %
Brady Statistic RV Percent Paced: 68.87 %
Date Time Interrogation Session: 20220121155746
Implantable Lead Implant Date: 20180729
Implantable Lead Implant Date: 20180729
Implantable Lead Location: 753859
Implantable Lead Location: 753860
Implantable Lead Model: 3830
Implantable Lead Model: 5076
Implantable Pulse Generator Implant Date: 20180729
Lead Channel Impedance Value: 304 Ohm
Lead Channel Impedance Value: 342 Ohm
Lead Channel Impedance Value: 399 Ohm
Lead Channel Impedance Value: 513 Ohm
Lead Channel Pacing Threshold Amplitude: 0.5 V
Lead Channel Pacing Threshold Amplitude: 1 V
Lead Channel Pacing Threshold Pulse Width: 0.4 ms
Lead Channel Pacing Threshold Pulse Width: 1 ms
Lead Channel Sensing Intrinsic Amplitude: 1.125 mV
Lead Channel Sensing Intrinsic Amplitude: 1.25 mV
Lead Channel Sensing Intrinsic Amplitude: 2.75 mV
Lead Channel Sensing Intrinsic Amplitude: 2.75 mV
Lead Channel Setting Pacing Amplitude: 2 V
Lead Channel Setting Pacing Amplitude: 2.5 V
Lead Channel Setting Pacing Pulse Width: 1 ms
Lead Channel Setting Sensing Sensitivity: 0.9 mV

## 2020-09-11 NOTE — Progress Notes (Signed)
    PCP: Ronita Hipps, MD   Primary EP:  Dr Rayann Heman  Erika Reyes is a 65 y.o. female who presents today for routine electrophysiology followup.  Since last being seen in our clinic, the patient reports doing very well.  Today, she denies symptoms of palpitations, chest pain, shortness of breath,  lower extremity edema, dizziness, presyncope, or syncope.  The patient is otherwise without complaint today.   Past Medical History:  Diagnosis Date  . Complete heart block (HCC)    a. s/p MDT dual chamber (His Bundle) PPM - Dr Rayann Heman  . Meningioma ([place]) 01/29/2014  . Osteoporosis of lumbar spine    Past Surgical History:  Procedure Laterality Date  . PACEMAKER IMPLANT N/A 03/19/2017   Procedure: Pacemaker Implant;  Surgeon: Thompson Grayer, MD;  Location: [facility];  Service: Cardiovascular;  Laterality: N/A;    ROS- all systems are reviewed and negative except as per HPI above  Current Outpatient Medications  Medication Sig Dispense Refill  . acetaminophen (TYLENOL) 500 MG tablet Take 500 mg by mouth every 6 (six) hours as needed (pain).     Marland Kitchen alendronate (FOSAMAX) 35 MG tablet Take 35 mg by mouth every 7 (seven) days.   2  . Cascara Sagrada-Senna-Nat Lax (BIOHM COLON CLEANSER) CAPS Take 2 capsules by mouth 2 (two) times daily.     Marland Kitchen ibuprofen (ADVIL,MOTRIN) 200 MG tablet Take 200 mg by mouth every 6 (six) hours as needed (pain).     . Multiple Vitamins-Minerals (MULTIVITAMIN ADULT PO) Take 2 tablets by mouth every morning.     [name] 3-6-9 Fatty Acids (OMEGA-3-6-9 PO) Take 2 capsules by mouth every evening.     . Probiotic Product (PROBIOTIC PO) Take 2 capsules by mouth every evening.      No current facility-administered medications for this visit.    Physical Exam: Vitals:   09/11/20 1502  BP: 134/78  Pulse: 73  Weight: 145 lb 3.2 oz (65.9 kg)  Height: 5\' 4"  (1.626 m)  PF: 98 L/min    GEN- The patient is well appearing, alert and oriented x 3 today.   Head-  normocephalic, atraumatic Eyes-  Sclera clear, conjunctiva pink Ears- hearing intact Oropharynx- clear Lungs- Clear to ausculation bilaterally, normal work of breathing Chest- pacemaker pocket is well healed Heart- Regular rate and rhythm, no murmurs, rubs or gallops, PMI not laterally displaced GI- soft, NT, ND, + BS Extremities- no clubbing, cyanosis, or edema  Pacemaker interrogation- reviewed in detail today,  See PACEART report  ekg tracing ordered today is personally reviewed and shows AV paced (his bundle pacing)  Assessment and Plan:  1. Symptomatic complete heart block Normal pacemaker function See Pace Art report No changes today RV threshold (his bundle pacing) is 1.5V@1  msec bipolar and 1V@1  msec unipolar. V paced 69 % but is device dependant today. I have adjusted RV output today to unipolar pacing at 2.5 V@1  msec to promote battery longevity .  2. afib Noted 12/30/18 lasting 5 minutes by device, none since We will avoid [place] unless her afib burden increases   Risks, benefits and potential toxicities for medications prescribed and/or refilled reviewed with patient today.   Return to see EP PA every 6 months  Thompson Grayer MD, [facility] 09/11/2020 3:07 PM

## 2020-09-11 NOTE — Patient Instructions (Signed)
Medication Instructions:  Continue current medications  *If you need a refill on your cardiac medications before your next appointment, please call your pharmacy*   Lab Work: none If you have labs (blood work) drawn today and your tests are completely normal, you will receive your results only by: Marland Kitchen MyChart Message (if you have MyChart) OR . A paper copy in the mail If you have any lab test that is abnormal or we need to change your treatment, we will call you to review the results.   Testing/Procedures: none   Follow-Up: At [facility], you and your health needs are our priority.  As part of our continuing mission to provide you with exceptional heart care, we have created designated Provider Care Teams.  These Care Teams include your primary Cardiologist (physician) and Advanced Practice Providers (APPs -  Physician Assistants and Nurse Practitioners) who all work together to provide you with the care you need, when you need it.  We recommend signing up for the patient portal called "MyChart".  Sign up information is provided on this After Visit Summary.  MyChart is used to connect with patients for Virtual Visits (Telemedicine).  Patients are able to view lab/test results, encounter notes, upcoming appointments, etc.  Non-urgent messages can be sent to your provider as well.   To learn more about what you can do with MyChart, go to [email or web address].    Your next appointment:   6 month(s), 1 year   The format for your next appointment:   In Person  Provider:   Tommye Standard, Dr. Rayann Heman   Other Instructions

## 2020-09-17 NOTE — Progress Notes (Signed)
Remote pacemaker transmission.   

## 2020-09-23 DIAGNOSIS — H5203 Hypermetropia, bilateral: Secondary | ICD-10-CM | POA: Diagnosis not present

## 2020-09-23 DIAGNOSIS — H1789 Other corneal scars and opacities: Secondary | ICD-10-CM | POA: Diagnosis not present

## 2020-09-23 DIAGNOSIS — H40033 Anatomical narrow angle, bilateral: Secondary | ICD-10-CM | POA: Diagnosis not present

## 2020-09-23 DIAGNOSIS — H18593 Other hereditary corneal dystrophies, bilateral: Secondary | ICD-10-CM | POA: Diagnosis not present

## 2020-12-01 ENCOUNTER — Ambulatory Visit (INDEPENDENT_AMBULATORY_CARE_PROVIDER_SITE_OTHER): Payer: BC Managed Care – PPO

## 2020-12-01 DIAGNOSIS — I442 Atrioventricular block, complete: Secondary | ICD-10-CM | POA: Diagnosis not present

## 2020-12-01 LAB — CUP PACEART REMOTE DEVICE CHECK
Battery Remaining Longevity: 21 mo
Battery Voltage: 2.91 V
Brady Statistic AP VP Percent: 28.51 %
Brady Statistic AP VS Percent: 0.57 %
Brady Statistic AS VP Percent: 69.66 %
Brady Statistic AS VS Percent: 1.26 %
Brady Statistic RA Percent Paced: 29.06 %
Brady Statistic RV Percent Paced: 98.17 %
Date Time Interrogation Session: 20220412002433
Implantable Lead Implant Date: 20180729
Implantable Lead Implant Date: 20180729
Implantable Lead Location: 753859
Implantable Lead Location: 753860
Implantable Lead Model: 3830
Implantable Lead Model: 5076
Implantable Pulse Generator Implant Date: 20180729
Lead Channel Impedance Value: 285 Ohm
Lead Channel Impedance Value: 304 Ohm
Lead Channel Impedance Value: 361 Ohm
Lead Channel Impedance Value: 513 Ohm
Lead Channel Pacing Threshold Amplitude: 0.5 V
Lead Channel Pacing Threshold Amplitude: 1.625 V
Lead Channel Pacing Threshold Pulse Width: 0.4 ms
Lead Channel Pacing Threshold Pulse Width: 0.4 ms
Lead Channel Sensing Intrinsic Amplitude: 1.875 mV
Lead Channel Sensing Intrinsic Amplitude: 1.875 mV
Lead Channel Sensing Intrinsic Amplitude: 2.625 mV
Lead Channel Sensing Intrinsic Amplitude: 2.625 mV
Lead Channel Setting Pacing Amplitude: 2 V
Lead Channel Setting Pacing Amplitude: 2.5 V
Lead Channel Setting Pacing Pulse Width: 1 ms
Lead Channel Setting Sensing Sensitivity: 0.9 mV

## 2020-12-16 NOTE — Progress Notes (Signed)
Remote pacemaker transmission.   

## 2021-03-02 ENCOUNTER — Ambulatory Visit (INDEPENDENT_AMBULATORY_CARE_PROVIDER_SITE_OTHER): Payer: Medicare Other

## 2021-03-02 DIAGNOSIS — I442 Atrioventricular block, complete: Secondary | ICD-10-CM

## 2021-03-02 LAB — CUP PACEART REMOTE DEVICE CHECK
Battery Remaining Longevity: 19 mo
Battery Voltage: 2.9 V
Brady Statistic AP VP Percent: 38.99 %
Brady Statistic AP VS Percent: 0 %
Brady Statistic AS VP Percent: 61.01 %
Brady Statistic AS VS Percent: 0 %
Brady Statistic RA Percent Paced: 38.95 %
Brady Statistic RV Percent Paced: 100 %
Date Time Interrogation Session: 20220711235315
Implantable Lead Implant Date: 20180729
Implantable Lead Implant Date: 20180729
Implantable Lead Location: 753859
Implantable Lead Location: 753860
Implantable Lead Model: 3830
Implantable Lead Model: 5076
Implantable Pulse Generator Implant Date: 20180729
Lead Channel Impedance Value: 304 Ohm
Lead Channel Impedance Value: 323 Ohm
Lead Channel Impedance Value: 380 Ohm
Lead Channel Impedance Value: 494 Ohm
Lead Channel Pacing Threshold Amplitude: 0.625 V
Lead Channel Pacing Threshold Amplitude: 1.625 V
Lead Channel Pacing Threshold Pulse Width: 0.4 ms
Lead Channel Pacing Threshold Pulse Width: 0.4 ms
Lead Channel Sensing Intrinsic Amplitude: 2.625 mV
Lead Channel Sensing Intrinsic Amplitude: 2.625 mV
Lead Channel Sensing Intrinsic Amplitude: 2.625 mV
Lead Channel Sensing Intrinsic Amplitude: 2.625 mV
Lead Channel Setting Pacing Amplitude: 2 V
Lead Channel Setting Pacing Amplitude: 2.5 V
Lead Channel Setting Pacing Pulse Width: 1 ms
Lead Channel Setting Sensing Sensitivity: 0.9 mV

## 2021-03-16 ENCOUNTER — Encounter: Payer: BC Managed Care – PPO | Admitting: Physician Assistant

## 2021-03-23 NOTE — Progress Notes (Signed)
Electrophysiology Office Note Date: 03/24/2021  ID:  Erika Reyes, Erika Reyes [birth year], MRN [id number]  PCP: Ronita Hipps, MD Primary Cardiologist: None Electrophysiologist: Thompson Grayer, MD   CC: Pacemaker follow-up  Erika Reyes is a 65 y.o. female seen today for Thompson Grayer, MD for routine electrophysiology followup.  Since last being seen in our clinic the patient reports doing very well.  she denies chest pain, palpitations, dyspnea, PND, orthopnea, nausea, vomiting, dizziness, syncope, edema, weight gain, or early satiety.  Device History: Medtronic Dual Chamber PPM implanted 2018 for CHB  Past Medical History:  Diagnosis Date   Complete heart block ([place])    a. s/p MDT dual chamber (His Bundle) PPM - Dr Rayann Heman   Meningioma ([place]) 01/29/2014   Osteoporosis of lumbar spine    Past Surgical History:  Procedure Laterality Date   PACEMAKER IMPLANT N/A 03/19/2017   Procedure: Pacemaker Implant;  Surgeon: Thompson Grayer, MD;  Location: [facility];  Service: Cardiovascular;  Laterality: N/A;    Current Outpatient Medications  Medication Sig Dispense Refill   acetaminophen (TYLENOL) 500 MG tablet Take 500 mg by mouth every 6 (six) hours as needed (pain).      alendronate (FOSAMAX) 35 MG tablet Take 35 mg by mouth every 7 (seven) days.   2   Ascorbic Acid (VITAMIN C) 1000 MG tablet Take 1,000 mg by mouth daily.     Cascara Sagr[place]-Senna-Nat Lax (BIOHM COLON CLEANSER) CAPS Take 2 capsules by mouth 2 (two) times daily.      Cholecalciferol (VITAMIN D-3) 125 MCG (5000 UT) TABS Take by mouth.     ibuprofen (ADVIL,MOTRIN) 200 MG tablet Take 200 mg by mouth every 6 (six) hours as needed (pain).      Multiple Vitamins-Minerals (MULTIVITAMIN ADULT PO) Take 2 tablets by mouth every morning.      Omega 3-6-9 Fatty Acids (OMEGA-3-6-9 PO) Take 2 capsules by mouth every evening.      Probiotic Product (PROBIOTIC PO) Take 2 capsules by mouth every evening.      Zinc Sulfate (ZINC 15 PO)  Take by mouth.     No current facility-administered medications for this visit.    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  Tobacco Use   Smoking status: Never   Smokeless tobacco: Never  Vaping Use   Vaping Use: Never used  Substance and Sexual Activity   Alcohol use: No   Drug use: No   Sexual activity: Yes    Partners: Male    Birth control/protection: None    Comment: Married  Other Topics Concern   Not on file  Social History Narrative   Lives at home w/ her husband   Right-handed   Caffeine: rare   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  Physical Exam: Vitals:   03/24/21 0858  BP: 130/82  Pulse: 77  SpO2: 95%  Weight: 148 lb 6.4 oz (67.3 kg)  Height: '5\' 4"'$  (1.626 m)     GEN- The patient is well appearing, alert and oriented x 3 today.   HEENT: normocephalic, atraumatic; sclera clear, conjunctiva pink; hearing intact; oropharynx clear; neck supple  Lungs- Clear to ausculation bilaterally, normal work of breathing.  No wheezes, rales, rhonchi Heart- Regular rate and rhythm, no murmurs, rubs or gallops  GI- soft, non-tender, non-distended, bowel sounds present  Extremities- no clubbing or cyanosis. No edema MS-  no significant deformity or atrophy Skin- warm and dry, no rash or lesion; PPM pocket well healed Psych- euthymic mood, full affect Neuro- strength and sensation are intact  PPM Interrogation- reviewed in detail today,  See PACEART report  EKG:  EKG is not ordered today.   Recent Labs: No results found for requested labs within last 8760 hours.   Wt Readings from Last 3 Encounters:  03/24/21 148 lb 6.4 oz (67.3 kg)  09/11/20 145 lb 3.2 oz (65.9 kg)  01/30/20 143 lb 1.9 oz (64.9 kg)     Other studies Reviewed: Additional studies/ records that were reviewed today include: Previous EP office notes, Previous remote checks, Most recent labwork.   Assessment and Plan:  1. CHB s/p Medtronic PPM  Normal PPM function See Pace Art report No changes today  2. Paroxysmal AF Burden <0.1%.  We will avoid [place] unless burden increases with CHA2DS2-VASc of 2, including female.   3. Leg paraesthesias Sounds like RLS Labs today and will defer management to PCP.   Current medicines are reviewed at length with the patient today.   The patient does not have concerns regarding her medicines.  The following changes were made today:  none  Labs/ tests ordered today include:  Orders Placed This Encounter  Procedures   Basic metabolic panel   CBC   Disposition:   Follow up with EP APP in 6 Months   Signed, Annamaria Helling  03/24/2021 9:50 AM  [facility] [address] [place] [address] [phone] (office) [phone] (fax)

## 2021-03-24 ENCOUNTER — Ambulatory Visit (INDEPENDENT_AMBULATORY_CARE_PROVIDER_SITE_OTHER): Payer: Medicare Other | Admitting: Student

## 2021-03-24 ENCOUNTER — Other Ambulatory Visit: Payer: Self-pay

## 2021-03-24 ENCOUNTER — Encounter: Payer: Self-pay | Admitting: Student

## 2021-03-24 VITALS — BP 130/82 | HR 77 | Ht 64.0 in | Wt 148.4 lb

## 2021-03-24 DIAGNOSIS — I480 Paroxysmal atrial fibrillation: Secondary | ICD-10-CM | POA: Diagnosis not present

## 2021-03-24 DIAGNOSIS — I442 Atrioventricular block, complete: Secondary | ICD-10-CM

## 2021-03-24 LAB — CUP PACEART INCLINIC DEVICE CHECK
Battery Remaining Longevity: 19 mo
Battery Voltage: 2.9 V
Brady Statistic AP VP Percent: 35.49 %
Brady Statistic AP VS Percent: 0.24 %
Brady Statistic AS VP Percent: 63.75 %
Brady Statistic AS VS Percent: 0.52 %
Brady Statistic RA Percent Paced: 35.7 %
Brady Statistic RV Percent Paced: 99.24 %
Date Time Interrogation Session: 20220803094647
Implantable Lead Implant Date: 20180729
Implantable Lead Implant Date: 20180729
Implantable Lead Location: 753859
Implantable Lead Location: 753860
Implantable Lead Model: 3830
Implantable Lead Model: 5076
Implantable Pulse Generator Implant Date: 20180729
Lead Channel Impedance Value: 323 Ohm
Lead Channel Impedance Value: 361 Ohm
Lead Channel Impedance Value: 418 Ohm
Lead Channel Impedance Value: 513 Ohm
Lead Channel Pacing Threshold Amplitude: 0.625 V
Lead Channel Pacing Threshold Amplitude: 1.5 V
Lead Channel Pacing Threshold Pulse Width: 0.4 ms
Lead Channel Pacing Threshold Pulse Width: 0.4 ms
Lead Channel Sensing Intrinsic Amplitude: 1 mV
Lead Channel Sensing Intrinsic Amplitude: 2.625 mV
Lead Channel Sensing Intrinsic Amplitude: 2.625 mV
Lead Channel Sensing Intrinsic Amplitude: 4 mV
Lead Channel Setting Pacing Amplitude: 2 V
Lead Channel Setting Pacing Amplitude: 2.5 V
Lead Channel Setting Pacing Pulse Width: 1 ms
Lead Channel Setting Sensing Sensitivity: 0.9 mV

## 2021-03-24 LAB — CBC
Hematocrit: 44.1 % (ref 34.0–46.6)
Hemoglobin: 14.7 g/dL (ref 11.1–15.9)
MCH: 30.1 pg (ref 26.6–33.0)
MCHC: 33.3 g/dL (ref 31.5–35.7)
MCV: 90 fL (ref 79–97)
Platelets: 305 10*3/uL (ref 150–450)
RBC: 4.89 x10E6/uL (ref 3.77–5.28)
RDW: 13.2 % (ref 11.7–15.4)
WBC: 10.5 10*3/uL (ref 3.4–10.8)

## 2021-03-24 LAB — BASIC METABOLIC PANEL
BUN/Creatinine Ratio: 28 (ref 12–28)
BUN: 20 mg/dL (ref 8–27)
CO2: 26 mmol/L (ref 20–29)
Calcium: 10.6 mg/dL — ABNORMAL HIGH (ref 8.7–10.3)
Chloride: 100 mmol/L (ref 96–106)
Creatinine, Ser: 0.72 mg/dL (ref 0.57–1.00)
Glucose: 91 mg/dL (ref 65–99)
Potassium: 4.3 mmol/L (ref 3.5–5.2)
Sodium: 138 mmol/L (ref 134–144)
eGFR: 93 mL/min/{1.73_m2} (ref >59)

## 2021-03-24 NOTE — Patient Instructions (Signed)
Medication Instructions:  Your physician recommends that you continue on your current medications as directed. Please refer to the Current Medication list given to you today.  *If you need a refill on your cardiac medications before your next appointment, please call your pharmacy*   Lab Work: TODAY: BMET, CBC  If you have labs (blood work) drawn today and your tests are completely normal, you will receive your results only by: [place] (if you have MyChart) OR A paper copy in the mail If you have any lab test that is abnormal or we need to change your treatment, we will call you to review the results.   Follow-Up: At [facility], you and your health needs are our priority.  As part of our continuing mission to provide you with exceptional heart care, we have created designated Provider Care Teams.  These Care Teams include your primary Cardiologist (physician) and Advanced Practice Providers (APPs -  Physician Assistants and Nurse Practitioners) who all work together to provide you with the care you need, when you need it.  We recommend signing up for the patient portal called "MyChart".  Sign up information is provided on this After Visit Summary.  MyChart is used to connect with patients for Virtual Visits (Telemedicine).  Patients are able to view lab/test results, encounter notes, upcoming appointments, etc.  Non-urgent messages can be sent to your provider as well.   To learn more about what you can do with MyChart, go to [email or web address].    Your next appointment:   6 month(s)  The format for your next appointment:   In Person  Provider:   You may see Thompson Grayer, MD or one of the following Advanced Practice Providers on your designated Care Team:   Tommye Standard, [place] Legrand Como "[facility]" [place], [place]

## 2021-03-25 NOTE — Progress Notes (Signed)
Remote pacemaker transmission.   

## 2021-05-22 DIAGNOSIS — S060XAA Concussion with loss of consciousness status unknown, initial encounter: Secondary | ICD-10-CM

## 2021-05-22 HISTORY — DX: Concussion with loss of consciousness status unknown, initial encounter: S06.0XAA

## 2021-06-01 ENCOUNTER — Ambulatory Visit (INDEPENDENT_AMBULATORY_CARE_PROVIDER_SITE_OTHER): Payer: Medicare Other

## 2021-06-01 DIAGNOSIS — I442 Atrioventricular block, complete: Secondary | ICD-10-CM

## 2021-06-01 LAB — CUP PACEART REMOTE DEVICE CHECK
Battery Remaining Longevity: 18 mo
Battery Voltage: 2.89 V
Brady Statistic AP VP Percent: 39.93 %
Brady Statistic AP VS Percent: 0 %
Brady Statistic AS VP Percent: 60.07 %
Brady Statistic AS VS Percent: 0 %
Brady Statistic RA Percent Paced: 39.9 %
Brady Statistic RV Percent Paced: 100 %
Date Time Interrogation Session: 20221010235132
Implantable Lead Implant Date: 20180729
Implantable Lead Implant Date: 20180729
Implantable Lead Location: 753859
Implantable Lead Location: 753860
Implantable Lead Model: 3830
Implantable Lead Model: 5076
Implantable Pulse Generator Implant Date: 20180729
Lead Channel Impedance Value: 285 Ohm
Lead Channel Impedance Value: 304 Ohm
Lead Channel Impedance Value: 361 Ohm
Lead Channel Impedance Value: 456 Ohm
Lead Channel Pacing Threshold Amplitude: 0.5 V
Lead Channel Pacing Threshold Amplitude: 1.625 V
Lead Channel Pacing Threshold Pulse Width: 0.4 ms
Lead Channel Pacing Threshold Pulse Width: 0.4 ms
Lead Channel Sensing Intrinsic Amplitude: 1.75 mV
Lead Channel Sensing Intrinsic Amplitude: 1.75 mV
Lead Channel Sensing Intrinsic Amplitude: 2.625 mV
Lead Channel Sensing Intrinsic Amplitude: 2.625 mV
Lead Channel Setting Pacing Amplitude: 2 V
Lead Channel Setting Pacing Amplitude: 2.5 V
Lead Channel Setting Pacing Pulse Width: 1 ms
Lead Channel Setting Sensing Sensitivity: 0.9 mV

## 2021-06-09 NOTE — Progress Notes (Signed)
Remote pacemaker transmission.   

## 2021-08-31 ENCOUNTER — Ambulatory Visit (INDEPENDENT_AMBULATORY_CARE_PROVIDER_SITE_OTHER): Payer: Medicare Other

## 2021-08-31 DIAGNOSIS — I442 Atrioventricular block, complete: Secondary | ICD-10-CM

## 2021-08-31 LAB — CUP PACEART REMOTE DEVICE CHECK
Battery Remaining Longevity: 12 mo
Battery Voltage: 2.87 V
Brady Statistic AP VP Percent: 23.61 %
Brady Statistic AP VS Percent: 0 %
Brady Statistic AS VP Percent: 76.39 %
Brady Statistic AS VS Percent: 0 %
Brady Statistic RA Percent Paced: 23.58 %
Brady Statistic RV Percent Paced: 100 %
Date Time Interrogation Session: 20230109222205
Implantable Lead Implant Date: 20180729
Implantable Lead Implant Date: 20180729
Implantable Lead Location: 753859
Implantable Lead Location: 753860
Implantable Lead Model: 3830
Implantable Lead Model: 5076
Implantable Pulse Generator Implant Date: 20180729
Lead Channel Impedance Value: 285 Ohm
Lead Channel Impedance Value: 304 Ohm
Lead Channel Impedance Value: 361 Ohm
Lead Channel Impedance Value: 513 Ohm
Lead Channel Pacing Threshold Amplitude: 0.5 V
Lead Channel Pacing Threshold Amplitude: 1.75 V
Lead Channel Pacing Threshold Pulse Width: 0.4 ms
Lead Channel Pacing Threshold Pulse Width: 0.4 ms
Lead Channel Sensing Intrinsic Amplitude: 2 mV
Lead Channel Sensing Intrinsic Amplitude: 2 mV
Lead Channel Sensing Intrinsic Amplitude: 2.625 mV
Lead Channel Sensing Intrinsic Amplitude: 2.625 mV
Lead Channel Setting Pacing Amplitude: 2 V
Lead Channel Setting Pacing Amplitude: 2.5 V
Lead Channel Setting Pacing Pulse Width: 1 ms
Lead Channel Setting Sensing Sensitivity: 0.9 mV

## 2021-09-09 NOTE — Progress Notes (Signed)
Remote pacemaker transmission.   

## 2021-10-13 ENCOUNTER — Encounter: Payer: Medicare Other | Admitting: Internal Medicine

## 2021-10-26 ENCOUNTER — Encounter: Payer: Self-pay | Admitting: *Deleted

## 2021-10-27 ENCOUNTER — Encounter: Payer: Self-pay | Admitting: Diagnostic Neuroimaging

## 2021-10-27 ENCOUNTER — Telehealth: Payer: Self-pay | Admitting: Diagnostic Neuroimaging

## 2021-10-27 ENCOUNTER — Ambulatory Visit (INDEPENDENT_AMBULATORY_CARE_PROVIDER_SITE_OTHER): Payer: Medicare Other | Admitting: Diagnostic Neuroimaging

## 2021-10-27 VITALS — BP 142/81 | HR 65 | Ht 64.0 in | Wt 151.0 lb

## 2021-10-27 DIAGNOSIS — D329 Benign neoplasm of meninges, unspecified: Secondary | ICD-10-CM | POA: Diagnosis not present

## 2021-10-27 DIAGNOSIS — I442 Atrioventricular block, complete: Secondary | ICD-10-CM

## 2021-10-27 DIAGNOSIS — R413 Other amnesia: Secondary | ICD-10-CM | POA: Diagnosis not present
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
## 2021-10-27 NOTE — Telephone Encounter (Signed)
Medicare/BCBS supp order faxed to [facility] they will reach out to the patient to schedule ?

## 2021-10-27 NOTE — Patient Instructions (Addendum)
Mild word finding difficulty ? ?- check MRI brain ? ?- call [facility] in [facility] for CPAP machine [phone] ?

## 2021-11-01 ENCOUNTER — Other Ambulatory Visit: Payer: Self-pay

## 2021-11-01 ENCOUNTER — Encounter: Payer: Self-pay | Admitting: Cardiology

## 2021-11-01 ENCOUNTER — Ambulatory Visit (INDEPENDENT_AMBULATORY_CARE_PROVIDER_SITE_OTHER): Payer: Medicare Other | Admitting: Cardiology

## 2021-11-01 VITALS — BP 132/76 | HR 62 | Ht 64.0 in | Wt 149.0 lb

## 2021-11-01 DIAGNOSIS — I4821 Permanent atrial fibrillation: Secondary | ICD-10-CM

## 2021-11-01 NOTE — Progress Notes (Signed)
? ?Electrophysiology Office Note ? ? ?Date:  11/01/2021  ? ?ID:  Erika Reyes, DOB Jan 03, 1956, MRN [id number] ? ?PCP:  Ronita Hipps, MD  ?Cardiologist:   ?Primary Electrophysiologist:  Jordann Grime Meredith Leeds, MD   ? ?Chief Complaint: pacemaker ?  ?History of Present Illness: ?Erika Reyes is a 66 y.o. female who is being seen today for the evaluation of pacemaker at the request of Ronita Hipps, MD. Presenting today for electrophysiology evaluation. ? ?She has a history of complete heart block.  She is status post Medtronic dual-chamber pacemaker implanted in 2018.  She also has paroxysmal atrial fibrillation found on device interrogation.  She has a less than 0.1% burden and thus she is not anticoagulated currently. ? ?Today, she denies symptoms of palpitations, chest pain, shortness of breath, orthopnea, PND, lower extremity edema, claudication, dizziness, presyncope, syncope, bleeding, or neurologic sequela. The patient is tolerating medications without difficulties.  ? ? ?Past Medical History:  ?Diagnosis Date  ? Cervical spondylosis   ? Complete heart block ([place])   ? a. s/p MDT dual chamber (His Bundle) PPM - Dr Rayann Heman  ? Concussion 05/2021  ? Meningioma ([place]) 01/29/2014  ? Osteoporosis of lumbar spine   ? Pacemaker   ? ?Past Surgical History:  ?Procedure Laterality Date  ? PACEMAKER IMPLANT N/A 03/19/2017  ? Procedure: Pacemaker Implant;  Surgeon: Thompson Grayer, MD;  Location: [facility];  Service: Cardiovascular;  Laterality: N/A;  ? ? ? ?Current Outpatient Medications  ?Medication Sig Dispense Refill  ? acetaminophen (TYLENOL) 500 MG tablet Take 500 mg by mouth every 6 (six) hours as needed (pain).     ? alendronate (FOSAMAX) 70 MG tablet Take 70 mg by mouth every 7 (seven) days.  2  ? Ascorbic Acid (VITAMIN C) 1000 MG tablet Take 1,000 mg by mouth daily.    ? Cascara Sagrada-Senna-Nat Lax (BIOHM COLON CLEANSER) CAPS Take 2 capsules by mouth 2 (two) times daily.     ? Cholecalciferol (VITAMIN D-3) 125  MCG (5000 UT) TABS Take 1 capsule by mouth daily.    ? ibuprofen (ADVIL,MOTRIN) 200 MG tablet Take 200 mg by mouth every 6 (six) hours as needed (pain).     ? Omega 3-6-9 Fatty Acids (OMEGA-3-6-9 PO) Take 2 capsules by mouth every evening.     ? Probiotic Product (PROBIOTIC PO) Take 2 capsules by mouth every evening.     ? Zinc Sulfate (ZINC 15 PO) Take 1 capsule by mouth daily.    ? ?No current facility-administered medications for this visit.  ? ? ?Allergies:   Celecoxib and Penicillin g  ? ?Social History:  The patient  reports that she has never smoked. She has never used smokeless tobacco. She reports that she does not drink alcohol and does not use drugs.  ? ?Family History:  The patient's family history includes Cancer in her mother; Dementia in her mother; Other in her father.  ? ? ?ROS:  Please see the history of present illness.   Otherwise, review of systems is positive for none.   All other systems are reviewed and negative.  ? ? ?PHYSICAL EXAM: ?VS:  BP 132/76   Pulse 62   Ht '5\' 4"'$  (1.626 m)   Wt 149 lb (67.6 kg)   SpO2 95%   BMI 25.58 kg/m?  , BMI Body mass index is 25.58 kg/m?. ?GEN: Well nourished, well developed, in no acute distress  ?HEENT: normal  ?Neck: no JVD, carotid bruits, or masses ?Cardiac: RRR;  no murmurs, rubs, or gallops,no edema  ?Respiratory:  clear to auscultation bilaterally, normal work of breathing ?GI: soft, nontender, nondistended, + BS ?MS: no deformity or atrophy  ?Skin: warm and dry, device pocket is well healed ?Neuro:  Strength and sensation are intact ?Psych: euthymic mood, full affect ? ?EKG:  EKG is ordered today. ?Personal review of the ekg ordered shows A sense, V paced ? ?Device interrogation is reviewed today in detail.  See PaceArt for details. ? ? ?Recent Labs: ?03/24/2021: BUN 20; Creatinine, Ser 0.72; Hemoglobin 14.7; Platelets 305; Potassium 4.3; Sodium 138  ? ? ?Lipid Panel  ?No results found for: CHOL, TRIG, HDL, CHOLHDL, VLDL, LDLCALC, LDLDIRECT ? ? ?Wt  Readings from Last 3 Encounters:  ?11/01/21 149 lb (67.6 kg)  ?10/27/21 151 lb (68.5 kg)  ?03/24/21 148 lb 6.4 oz (67.3 kg)  ?  ? ? ?Other studies Reviewed: ?Additional studies/ records that were reviewed today include: TTE 2018  ?Review of the above records today demonstrates:  ?- Left ventricle: The cavity size was normal. Wall thickness was  ?  normal. Systolic function was normal. The estimated ejection  ?  fraction was in the range of 60% to 65%. Wall motion was normal;  ?  there were no regional wall motion abnormalities. The study is  ?  not technically sufficient to allow evaluation of LV diastolic  ?  function.  ?- Aortic valve: Mildly calcified annulus. Trileaflet; mildly  ?  calcified leaflets.  ?- Mitral valve: There was trivial regurgitation.  ?- Right atrium: Central venous pressure (est): 15 mm Hg.  ?- Atrial septum: No defect or patent foramen ovale was identified.  ?- Tricuspid valve: There was mild regurgitation.  ?- Pulmonary arteries: PA peak pressure: 39 mm Hg (S).  ?- Pericardium, extracardiac: There was no pericardial effusion.  ? ? ?ASSESSMENT AND PLAN: ? ?1.  Complete heart block: Status post Medtronic dual-chamber pacemaker implanted in 2018.  Device functioning appropriately.  No changes at this time. ? ?2.  Paroxysmal atrial fibrillation: Burden of less than 1.1%.  All short episodes.  CHA2DS2-VASc of 2, though we Semir Brill plan to avoid anticoagulation as her episodes are quite short. ? ? ? ?Current medicines are reviewed at length with the patient today.   ?The patient does not have concerns regarding her medicines.  The following changes were made today:  none ? ?Labs/ tests ordered today include:  ?Orders Placed This Encounter  ?Procedures  ? EKG 12-Lead  ? ? ? ?Disposition:   FU with Vanesha [place] 1 year ? ?Signed, ?Laquasia Pincus Meredith Leeds, MD  ?11/01/2021 4:53 PM    ? ?[facility] ?[address] ?[address] ?[place] [address] ?[phone] (office) ?[phone] (fax) ? ?

## 2021-11-01 NOTE — Patient Instructions (Signed)
Medication Instructions:  ?No changes ?*If you need a refill on your cardiac medications before your next appointment, please call your pharmacy* ? ? ?Lab Work: ?none ?If you have labs (blood work) drawn today and your tests are completely normal, you will receive your results only by: ?MyChart Message (if you have MyChart) OR ?A paper copy in the mail ?If you have any lab test that is abnormal or we need to change your treatment, we will call you to review the results. ? ? ?Testing/Procedures: ?none ? ? ?Follow-Up: ?At [facility], you and your health needs are our priority.  As part of our continuing mission to provide you with exceptional heart care, we have created designated Provider Care Teams.  These Care Teams include your primary Cardiologist (physician) and Advanced Practice Providers (APPs -  Physician Assistants and Nurse Practitioners) who all work together to provide you with the care you need, when you need it. ? ?We recommend signing up for the patient portal called "MyChart".  Sign up information is provided on this After Visit Summary.  MyChart is used to connect with patients for Virtual Visits (Telemedicine).  Patients are able to view lab/test results, encounter notes, upcoming appointments, etc.  Non-urgent messages can be sent to your provider as well.   ?To learn more about what you can do with MyChart, go to [email or web address].   ? ?Your next appointment:   ?1 year(s) ? ?The format for your next appointment:   ?In Person ? ?Provider:   ?Dr Curt Bears   ? ? ?Other Instructions ?none  ?

## 2021-11-30 ENCOUNTER — Ambulatory Visit (INDEPENDENT_AMBULATORY_CARE_PROVIDER_SITE_OTHER): Payer: Medicare Other

## 2021-11-30 DIAGNOSIS — I442 Atrioventricular block, complete: Secondary | ICD-10-CM

## 2021-11-30 LAB — CUP PACEART REMOTE DEVICE CHECK
Battery Remaining Longevity: 10 mo
Battery Voltage: 2.84 V
Brady Statistic AP VP Percent: 34.17 %
Brady Statistic AP VS Percent: 0 %
Brady Statistic AS VP Percent: 65.83 %
Brady Statistic AS VS Percent: 0 %
Brady Statistic RA Percent Paced: 34.15 %
Brady Statistic RV Percent Paced: 100 %
Date Time Interrogation Session: 20230411081433
Implantable Lead Implant Date: 20180729
Implantable Lead Implant Date: 20180729
Implantable Lead Location: 753859
Implantable Lead Location: 753860
Implantable Lead Model: 3830
Implantable Lead Model: 5076
Implantable Pulse Generator Implant Date: 20180729
Lead Channel Impedance Value: 285 Ohm
Lead Channel Impedance Value: 323 Ohm
Lead Channel Impedance Value: 380 Ohm
Lead Channel Impedance Value: 475 Ohm
Lead Channel Pacing Threshold Amplitude: 0.5 V
Lead Channel Pacing Threshold Amplitude: 1.625 V
Lead Channel Pacing Threshold Pulse Width: 0.4 ms
Lead Channel Pacing Threshold Pulse Width: 0.4 ms
Lead Channel Sensing Intrinsic Amplitude: 2.625 mV
Lead Channel Sensing Intrinsic Amplitude: 2.625 mV
Lead Channel Sensing Intrinsic Amplitude: 2.625 mV
Lead Channel Sensing Intrinsic Amplitude: 2.625 mV
Lead Channel Setting Pacing Amplitude: 2 V
Lead Channel Setting Pacing Amplitude: 2.5 V
Lead Channel Setting Pacing Pulse Width: 1 ms
Lead Channel Setting Sensing Sensitivity: 0.9 mV

## 2021-12-16 NOTE — Progress Notes (Signed)
Remote pacemaker transmission.   

## 2022-03-01 ENCOUNTER — Ambulatory Visit: Payer: Medicare Other

## 2022-03-01 ENCOUNTER — Ambulatory Visit (INDEPENDENT_AMBULATORY_CARE_PROVIDER_SITE_OTHER): Payer: Medicare Other

## 2022-03-01 DIAGNOSIS — I442 Atrioventricular block, complete: Secondary | ICD-10-CM

## 2022-03-01 LAB — CUP PACEART REMOTE DEVICE CHECK
Battery Remaining Longevity: 7 mo
Battery Remaining Longevity: 7 mo
Battery Voltage: 2.8 V
Battery Voltage: 2.8 V
Brady Statistic AP VP Percent: 46.44 %
Brady Statistic AP VP Percent: 46.44 %
Brady Statistic AP VS Percent: 0 %
Brady Statistic AP VS Percent: 0 %
Brady Statistic AS VP Percent: 53.55 %
Brady Statistic AS VP Percent: 53.55 %
Brady Statistic AS VS Percent: 0 %
Brady Statistic AS VS Percent: 0 %
Brady Statistic RA Percent Paced: 46.43 %
Brady Statistic RA Percent Paced: 46.43 %
Brady Statistic RV Percent Paced: 100 %
Brady Statistic RV Percent Paced: 100 %
Date Time Interrogation Session: 20230710193639
Date Time Interrogation Session: 20230710193639
Implantable Lead Implant Date: 20180729
Implantable Lead Implant Date: 20180729
Implantable Lead Implant Date: 20180729
Implantable Lead Implant Date: 20180729
Implantable Lead Location: 753859
Implantable Lead Location: 753860
Implantable Lead Location: 753859
Implantable Lead Location: 753860
Implantable Lead Model: 3830
Implantable Lead Model: 5076
Implantable Lead Model: 3830
Implantable Lead Model: 5076
Implantable Pulse Generator Implant Date: 20180729
Implantable Pulse Generator Implant Date: 20180729
Lead Channel Impedance Value: 285 Ohm
Lead Channel Impedance Value: 323 Ohm
Lead Channel Impedance Value: 380 Ohm
Lead Channel Impedance Value: 437 Ohm
Lead Channel Impedance Value: 285 Ohm
Lead Channel Impedance Value: 323 Ohm
Lead Channel Impedance Value: 380 Ohm
Lead Channel Impedance Value: 437 Ohm
Lead Channel Pacing Threshold Amplitude: 0.5 V
Lead Channel Pacing Threshold Amplitude: 1.875 V
Lead Channel Pacing Threshold Amplitude: 0.5 V
Lead Channel Pacing Threshold Amplitude: 1.875 V
Lead Channel Pacing Threshold Pulse Width: 0.4 ms
Lead Channel Pacing Threshold Pulse Width: 0.4 ms
Lead Channel Pacing Threshold Pulse Width: 0.4 ms
Lead Channel Pacing Threshold Pulse Width: 0.4 ms
Lead Channel Sensing Intrinsic Amplitude: 1.75 mV
Lead Channel Sensing Intrinsic Amplitude: 1.75 mV
Lead Channel Sensing Intrinsic Amplitude: 2.625 mV
Lead Channel Sensing Intrinsic Amplitude: 2.625 mV
Lead Channel Sensing Intrinsic Amplitude: 1.75 mV
Lead Channel Sensing Intrinsic Amplitude: 1.75 mV
Lead Channel Sensing Intrinsic Amplitude: 2.625 mV
Lead Channel Sensing Intrinsic Amplitude: 2.625 mV
Lead Channel Setting Pacing Amplitude: 2 V
Lead Channel Setting Pacing Amplitude: 2.5 V
Lead Channel Setting Pacing Amplitude: 2 V
Lead Channel Setting Pacing Amplitude: 2.5 V
Lead Channel Setting Pacing Pulse Width: 1 ms
Lead Channel Setting Pacing Pulse Width: 1 ms
Lead Channel Setting Sensing Sensitivity: 0.9 mV
Lead Channel Setting Sensing Sensitivity: 0.9 mV

## 2022-03-23 NOTE — Progress Notes (Signed)
Remote pacemaker transmission.   

## 2022-04-01 ENCOUNTER — Ambulatory Visit (INDEPENDENT_AMBULATORY_CARE_PROVIDER_SITE_OTHER): Payer: Medicare Other

## 2022-04-01 DIAGNOSIS — I442 Atrioventricular block, complete: Secondary | ICD-10-CM

## 2022-04-04 LAB — CUP PACEART REMOTE DEVICE CHECK
Battery Remaining Longevity: 6 mo
Battery Voltage: 2.79 V
Brady Statistic AP VP Percent: 57.83 %
Brady Statistic AP VS Percent: 0 %
Brady Statistic AS VP Percent: 42.17 %
Brady Statistic AS VS Percent: 0 %
Brady Statistic RA Percent Paced: 57.82 %
Brady Statistic RV Percent Paced: 100 %
Date Time Interrogation Session: 20230811150903
Implantable Lead Implant Date: 20180729
Implantable Lead Implant Date: 20180729
Implantable Lead Location: 753859
Implantable Lead Location: 753860
Implantable Lead Model: 3830
Implantable Lead Model: 5076
Implantable Pulse Generator Implant Date: 20180729
Lead Channel Impedance Value: 285 Ohm
Lead Channel Impedance Value: 323 Ohm
Lead Channel Impedance Value: 380 Ohm
Lead Channel Impedance Value: 475 Ohm
Lead Channel Pacing Threshold Amplitude: 0.5 V
Lead Channel Pacing Threshold Amplitude: 1.625 V
Lead Channel Pacing Threshold Pulse Width: 0.4 ms
Lead Channel Pacing Threshold Pulse Width: 0.4 ms
Lead Channel Sensing Intrinsic Amplitude: 0.75 mV
Lead Channel Sensing Intrinsic Amplitude: 0.75 mV
Lead Channel Sensing Intrinsic Amplitude: 2.625 mV
Lead Channel Sensing Intrinsic Amplitude: 2.625 mV
Lead Channel Setting Pacing Amplitude: 2 V
Lead Channel Setting Pacing Amplitude: 2.5 V
Lead Channel Setting Pacing Pulse Width: 1 ms
Lead Channel Setting Sensing Sensitivity: 0.9 mV

## 2022-04-26 NOTE — Progress Notes (Signed)
Remote pacemaker transmission.   

## 2022-04-26 NOTE — Addendum Note (Signed)
Addended by: Douglass Rivers D on: 04/26/2022 09:07 AM   Modules accepted: Level of Service

## 2022-05-02 ENCOUNTER — Ambulatory Visit (INDEPENDENT_AMBULATORY_CARE_PROVIDER_SITE_OTHER): Payer: Medicare Other

## 2022-05-02 DIAGNOSIS — I442 Atrioventricular block, complete: Secondary | ICD-10-CM

## 2022-05-02 LAB — CUP PACEART REMOTE DEVICE CHECK
Battery Remaining Longevity: 5 mo
Battery Voltage: 2.76 V
Brady Statistic AP VP Percent: 48.7 %
Brady Statistic AP VS Percent: 0 %
Brady Statistic AS VP Percent: 51.3 %
Brady Statistic AS VS Percent: 0 %
Brady Statistic RA Percent Paced: 48.69 %
Brady Statistic RV Percent Paced: 100 %
Date Time Interrogation Session: 20230911012744
Implantable Lead Implant Date: 20180729
Implantable Lead Implant Date: 20180729
Implantable Lead Location: 753859
Implantable Lead Location: 753860
Implantable Lead Model: 3830
Implantable Lead Model: 5076
Implantable Pulse Generator Implant Date: 20180729
Lead Channel Impedance Value: 285 Ohm
Lead Channel Impedance Value: 323 Ohm
Lead Channel Impedance Value: 380 Ohm
Lead Channel Impedance Value: 437 Ohm
Lead Channel Pacing Threshold Amplitude: 0.5 V
Lead Channel Pacing Threshold Amplitude: 1.75 V
Lead Channel Pacing Threshold Pulse Width: 0.4 ms
Lead Channel Pacing Threshold Pulse Width: 0.4 ms
Lead Channel Sensing Intrinsic Amplitude: 1.875 mV
Lead Channel Sensing Intrinsic Amplitude: 1.875 mV
Lead Channel Sensing Intrinsic Amplitude: 2.625 mV
Lead Channel Sensing Intrinsic Amplitude: 2.625 mV
Lead Channel Setting Pacing Amplitude: 2 V
Lead Channel Setting Pacing Amplitude: 2.5 V
Lead Channel Setting Pacing Pulse Width: 1 ms
Lead Channel Setting Sensing Sensitivity: 0.9 mV

## 2022-05-20 NOTE — Progress Notes (Signed)
Remote pacemaker transmission.   

## 2022-06-02 ENCOUNTER — Ambulatory Visit (INDEPENDENT_AMBULATORY_CARE_PROVIDER_SITE_OTHER): Payer: Medicare Other

## 2022-06-02 DIAGNOSIS — I442 Atrioventricular block, complete: Secondary | ICD-10-CM

## 2022-06-02 LAB — CUP PACEART REMOTE DEVICE CHECK
Battery Remaining Longevity: 4 mo
Battery Voltage: 2.73 V
Brady Statistic AP VP Percent: 50.74 %
Brady Statistic AP VS Percent: 0 %
Brady Statistic AS VP Percent: 49.26 %
Brady Statistic AS VS Percent: 0 %
Brady Statistic RA Percent Paced: 50.71 %
Brady Statistic RV Percent Paced: 100 %
Date Time Interrogation Session: 20231011224949
Implantable Lead Implant Date: 20180729
Implantable Lead Implant Date: 20180729
Implantable Lead Location: 753859
Implantable Lead Location: 753860
Implantable Lead Model: 3830
Implantable Lead Model: 5076
Implantable Pulse Generator Implant Date: 20180729
Lead Channel Impedance Value: 285 Ohm
Lead Channel Impedance Value: 323 Ohm
Lead Channel Impedance Value: 380 Ohm
Lead Channel Impedance Value: 475 Ohm
Lead Channel Pacing Threshold Amplitude: 0.5 V
Lead Channel Pacing Threshold Amplitude: 1.75 V
Lead Channel Pacing Threshold Pulse Width: 0.4 ms
Lead Channel Pacing Threshold Pulse Width: 0.4 ms
Lead Channel Sensing Intrinsic Amplitude: 1.375 mV
Lead Channel Sensing Intrinsic Amplitude: 1.375 mV
Lead Channel Sensing Intrinsic Amplitude: 2.625 mV
Lead Channel Sensing Intrinsic Amplitude: 2.625 mV
Lead Channel Setting Pacing Amplitude: 2 V
Lead Channel Setting Pacing Amplitude: 2.5 V
Lead Channel Setting Pacing Pulse Width: 1 ms
Lead Channel Setting Sensing Sensitivity: 0.9 mV

## 2022-06-09 NOTE — Progress Notes (Signed)
Remote pacemaker transmission.   

## 2022-07-04 ENCOUNTER — Ambulatory Visit (INDEPENDENT_AMBULATORY_CARE_PROVIDER_SITE_OTHER): Payer: Medicare Other

## 2022-07-04 DIAGNOSIS — I442 Atrioventricular block, complete: Secondary | ICD-10-CM

## 2022-07-05 LAB — CUP PACEART REMOTE DEVICE CHECK
Battery Remaining Longevity: 2 mo
Battery Voltage: 2.7 V
Brady Statistic AP VP Percent: 43.4 %
Brady Statistic AP VS Percent: 0 %
Brady Statistic AS VP Percent: 56.6 %
Brady Statistic AS VS Percent: 0 %
Brady Statistic RA Percent Paced: 43.38 %
Brady Statistic RV Percent Paced: 100 %
Date Time Interrogation Session: 20231112223353
Implantable Lead Connection Status: 753985
Implantable Lead Connection Status: 753985
Implantable Lead Implant Date: 20180729
Implantable Lead Implant Date: 20180729
Implantable Lead Location: 753859
Implantable Lead Location: 753860
Implantable Lead Model: 3830
Implantable Lead Model: 5076
Implantable Pulse Generator Implant Date: 20180729
Lead Channel Impedance Value: 266 Ohm
Lead Channel Impedance Value: 304 Ohm
Lead Channel Impedance Value: 361 Ohm
Lead Channel Impedance Value: 475 Ohm
Lead Channel Pacing Threshold Amplitude: 0.375 V
Lead Channel Pacing Threshold Amplitude: 1.625 V
Lead Channel Pacing Threshold Pulse Width: 0.4 ms
Lead Channel Pacing Threshold Pulse Width: 0.4 ms
Lead Channel Sensing Intrinsic Amplitude: 1.375 mV
Lead Channel Sensing Intrinsic Amplitude: 1.375 mV
Lead Channel Sensing Intrinsic Amplitude: 2.625 mV
Lead Channel Sensing Intrinsic Amplitude: 2.625 mV
Lead Channel Setting Pacing Amplitude: 2 V
Lead Channel Setting Pacing Amplitude: 2.5 V
Lead Channel Setting Pacing Pulse Width: 1 ms
Lead Channel Setting Sensing Sensitivity: 0.9 mV
Zone Setting Status: 755011
Zone Setting Status: 755011

## 2022-08-04 ENCOUNTER — Ambulatory Visit (INDEPENDENT_AMBULATORY_CARE_PROVIDER_SITE_OTHER): Payer: Medicare Other

## 2022-08-04 DIAGNOSIS — I442 Atrioventricular block, complete: Secondary | ICD-10-CM

## 2022-08-04 LAB — CUP PACEART REMOTE DEVICE CHECK
Battery Remaining Longevity: 1 mo
Battery Voltage: 2.66 V
Brady Statistic AP VP Percent: 42.63 %
Brady Statistic AP VS Percent: 0 %
Brady Statistic AS VP Percent: 57.37 %
Brady Statistic AS VS Percent: 0 %
Brady Statistic RA Percent Paced: 42.59 %
Brady Statistic RV Percent Paced: 100 %
Date Time Interrogation Session: 20231214052041
Implantable Lead Connection Status: 753985
Implantable Lead Connection Status: 753985
Implantable Lead Implant Date: 20180729
Implantable Lead Implant Date: 20180729
Implantable Lead Location: 753859
Implantable Lead Location: 753860
Implantable Lead Model: 3830
Implantable Lead Model: 5076
Implantable Pulse Generator Implant Date: 20180729
Lead Channel Impedance Value: 266 Ohm
Lead Channel Impedance Value: 323 Ohm
Lead Channel Impedance Value: 380 Ohm
Lead Channel Impedance Value: 399 Ohm
Lead Channel Pacing Threshold Amplitude: 0.5 V
Lead Channel Pacing Threshold Amplitude: 1.625 V
Lead Channel Pacing Threshold Pulse Width: 0.4 ms
Lead Channel Pacing Threshold Pulse Width: 0.4 ms
Lead Channel Sensing Intrinsic Amplitude: 2.125 mV
Lead Channel Sensing Intrinsic Amplitude: 2.125 mV
Lead Channel Sensing Intrinsic Amplitude: 2.625 mV
Lead Channel Sensing Intrinsic Amplitude: 2.625 mV
Lead Channel Setting Pacing Amplitude: 2 V
Lead Channel Setting Pacing Amplitude: 2.5 V
Lead Channel Setting Pacing Pulse Width: 1 ms
Lead Channel Setting Sensing Sensitivity: 0.9 mV
Zone Setting Status: 755011
Zone Setting Status: 755011

## 2022-08-16 NOTE — Progress Notes (Signed)
Remote pacemaker transmission.   

## 2022-08-16 NOTE — Addendum Note (Signed)
Addended by: Douglass Rivers D on: 08/16/2022 01:13 PM   Modules accepted: Level of Service

## 2022-08-26 NOTE — Progress Notes (Signed)
Remote pacemaker transmission.   

## 2022-09-05 ENCOUNTER — Ambulatory Visit (INDEPENDENT_AMBULATORY_CARE_PROVIDER_SITE_OTHER): Payer: Medicare Other

## 2022-09-05 DIAGNOSIS — I442 Atrioventricular block, complete: Secondary | ICD-10-CM

## 2022-09-06 LAB — CUP PACEART REMOTE DEVICE CHECK
Battery Remaining Longevity: 1 mo — CL
Battery Voltage: 2.64 V
Brady Statistic AP VP Percent: 43.37 %
Brady Statistic AP VS Percent: 0 %
Brady Statistic AS VP Percent: 56.62 %
Brady Statistic AS VS Percent: 0 %
Brady Statistic RA Percent Paced: 43.34 %
Brady Statistic RV Percent Paced: 100 %
Date Time Interrogation Session: 20240114234638
Implantable Lead Connection Status: 753985
Implantable Lead Connection Status: 753985
Implantable Lead Implant Date: 20180729
Implantable Lead Implant Date: 20180729
Implantable Lead Location: 753859
Implantable Lead Location: 753860
Implantable Lead Model: 3830
Implantable Lead Model: 5076
Implantable Pulse Generator Implant Date: 20180729
Lead Channel Impedance Value: 285 Ohm
Lead Channel Impedance Value: 304 Ohm
Lead Channel Impedance Value: 361 Ohm
Lead Channel Impedance Value: 437 Ohm
Lead Channel Pacing Threshold Amplitude: 0.625 V
Lead Channel Pacing Threshold Amplitude: 1.375 V
Lead Channel Pacing Threshold Pulse Width: 0.4 ms
Lead Channel Pacing Threshold Pulse Width: 0.4 ms
Lead Channel Sensing Intrinsic Amplitude: 0.875 mV
Lead Channel Sensing Intrinsic Amplitude: 0.875 mV
Lead Channel Sensing Intrinsic Amplitude: 2.625 mV
Lead Channel Sensing Intrinsic Amplitude: 2.625 mV
Lead Channel Setting Pacing Amplitude: 2 V
Lead Channel Setting Pacing Amplitude: 2.5 V
Lead Channel Setting Pacing Pulse Width: 1 ms
Lead Channel Setting Sensing Sensitivity: 0.9 mV
Zone Setting Status: 755011
Zone Setting Status: 755011

## 2022-10-06 ENCOUNTER — Ambulatory Visit (INDEPENDENT_AMBULATORY_CARE_PROVIDER_SITE_OTHER): Payer: Medicare Other

## 2022-10-06 DIAGNOSIS — I442 Atrioventricular block, complete: Secondary | ICD-10-CM

## 2022-10-10 ENCOUNTER — Telehealth: Payer: Self-pay

## 2022-10-10 LAB — CUP PACEART REMOTE DEVICE CHECK
Battery Remaining Longevity: 1 mo — CL
Battery Voltage: 2.62 V
Brady Statistic AP VP Percent: 41.51 %
Brady Statistic AP VS Percent: 0 %
Brady Statistic AS VP Percent: 58.48 %
Brady Statistic AS VS Percent: 0 %
Brady Statistic RA Percent Paced: 41.48 %
Brady Statistic RV Percent Paced: 100 %
Date Time Interrogation Session: 20240219091807
Implantable Lead Connection Status: 753985
Implantable Lead Connection Status: 753985
Implantable Lead Implant Date: 20180729
Implantable Lead Implant Date: 20180729
Implantable Lead Location: 753859
Implantable Lead Location: 753860
Implantable Lead Model: 3830
Implantable Lead Model: 5076
Implantable Pulse Generator Implant Date: 20180729
Lead Channel Impedance Value: 285 Ohm
Lead Channel Impedance Value: 304 Ohm
Lead Channel Impedance Value: 361 Ohm
Lead Channel Impedance Value: 399 Ohm
Lead Channel Pacing Threshold Amplitude: 0.5 V
Lead Channel Pacing Threshold Amplitude: 1.625 V
Lead Channel Pacing Threshold Pulse Width: 0.4 ms
Lead Channel Pacing Threshold Pulse Width: 0.4 ms
Lead Channel Sensing Intrinsic Amplitude: 1.875 mV
Lead Channel Sensing Intrinsic Amplitude: 1.875 mV
Lead Channel Sensing Intrinsic Amplitude: 2.625 mV
Lead Channel Sensing Intrinsic Amplitude: 2.625 mV
Lead Channel Setting Pacing Amplitude: 2 V
Lead Channel Setting Pacing Amplitude: 2.5 V
Lead Channel Setting Pacing Pulse Width: 1 ms
Lead Channel Setting Sensing Sensitivity: 0.9 mV
Zone Setting Status: 755011
Zone Setting Status: 755011

## 2022-10-10 NOTE — Telephone Encounter (Signed)
Scheduled non-billable remote reviewed. Normal device function.   Device reached RRT 2/11 - route to triage Next remote to be determined [place]  [facility] for patient to call back so we can make her aware. She needs appt with Dr. Curt Bears to discuss gen change. Will forward to scheduler and nurse to follow up. She is an Technical sales engineer patient.

## 2022-10-12 NOTE — Telephone Encounter (Signed)
Pt scheduled for follow up with [facility] 10/24/22.  Sent Mychart message advising of RRT.  Requested call back if any questions.

## 2022-10-17 NOTE — Progress Notes (Signed)
Remote pacemaker transmission.   

## 2022-10-19 NOTE — Telephone Encounter (Signed)
LM to call back.

## 2022-10-24 ENCOUNTER — Ambulatory Visit: Payer: Medicare Other | Attending: Cardiology | Admitting: Cardiology

## 2022-10-24 ENCOUNTER — Encounter: Payer: Self-pay | Admitting: Cardiology

## 2022-10-24 VITALS — BP 140/82 | HR 61 | Ht 64.0 in | Wt 151.0 lb

## 2022-10-24 DIAGNOSIS — R0602 Shortness of breath: Secondary | ICD-10-CM | POA: Diagnosis present

## 2022-10-24 DIAGNOSIS — I442 Atrioventricular block, complete: Secondary | ICD-10-CM

## 2022-10-24 LAB — CUP PACEART INCLINIC DEVICE CHECK
Battery Remaining Longevity: 1 mo — CL
Battery Voltage: 2.62 V
Brady Statistic AP VP Percent: 45.25 %
Brady Statistic AP VS Percent: 0 %
Brady Statistic AS VP Percent: 54.75 %
Brady Statistic AS VS Percent: 0 %
Brady Statistic RA Percent Paced: 45.23 %
Brady Statistic RV Percent Paced: 100 %
Date Time Interrogation Session: 20240304170707
Implantable Lead Connection Status: 753985
Implantable Lead Connection Status: 753985
Implantable Lead Implant Date: 20180729
Implantable Lead Implant Date: 20180729
Implantable Lead Location: 753859
Implantable Lead Location: 753860
Implantable Lead Model: 3830
Implantable Lead Model: 5076
Implantable Pulse Generator Implant Date: 20180729
Lead Channel Impedance Value: 304 Ohm
Lead Channel Impedance Value: 342 Ohm
Lead Channel Impedance Value: 399 Ohm
Lead Channel Impedance Value: 437 Ohm
Lead Channel Pacing Threshold Amplitude: 0.5 V
Lead Channel Pacing Threshold Amplitude: 1 V
Lead Channel Pacing Threshold Amplitude: 1.75 V
Lead Channel Pacing Threshold Pulse Width: 0.4 ms
Lead Channel Pacing Threshold Pulse Width: 0.4 ms
Lead Channel Pacing Threshold Pulse Width: 1 ms
Lead Channel Sensing Intrinsic Amplitude: 0 mV
Lead Channel Sensing Intrinsic Amplitude: 1.75 mV
Lead Channel Sensing Intrinsic Amplitude: 2 mV
Lead Channel Sensing Intrinsic Amplitude: 2.625 mV
Lead Channel Sensing Intrinsic Amplitude: 2.625 mV
Lead Channel Setting Pacing Amplitude: 2 V
Lead Channel Setting Pacing Amplitude: 2.5 V
Lead Channel Setting Pacing Pulse Width: 1 ms
Lead Channel Setting Sensing Sensitivity: 0.9 mV
Zone Setting Status: 755011
Zone Setting Status: 755011

## 2022-10-24 NOTE — Progress Notes (Unsigned)
Electrophysiology Office Note   Date:  10/25/2022   ID:  Erika Reyes, DOB 1956/03/07, MRN [id number]  PCP:  Ronita Hipps, MD  Cardiologist:   Primary Electrophysiologist:  Barbarita Hutmacher Meredith Leeds, MD    Chief Complaint: pacemaker   History of Present Illness: Erika Reyes is a 67 y.o. female who is being seen today for the evaluation of pacemaker at the request of Ronita Hipps, MD. Presenting today for electrophysiology evaluation.  She has a history significant for complete heart block.  She is status post Medtronic dual-chamber pacemaker implanted 2018.  She also has paroxysmal atrial fibrillation found on device interrogation.  Burden less than 0.1% and thus not anticoagulated.  Today, denies symptoms of palpitations, chest pain, orthopnea, PND, lower extremity edema, claudication, dizziness, presyncope, syncope, bleeding, or neurologic sequela. The patient is tolerating medications without difficulties.  He has been having episodes of shortness of breath.  She noted last summer that she was more short of breath that mainly occurs when she bends down or kneel down and stands back up.  She is also short of breath when she is exerting herself.  She likes to mow the lawn and uses a push mower and has noted more shortness of breath when she does this.  She is able to lie flat, though she does wear a CPAP.    Past Medical History:  Diagnosis Date   Cervical spondylosis    Complete heart block ([place])    a. s/p MDT dual chamber (His Bundle) PPM - Dr Rayann Heman   Concussion 05/2021   Meningioma ([place]) 01/29/2014   Osteoporosis of lumbar spine    Pacemaker    Past Surgical History:  Procedure Laterality Date   PACEMAKER IMPLANT N/A 03/19/2017   Procedure: Pacemaker Implant;  Surgeon: Thompson Grayer, MD;  Location: [place] [facility];  Service: Cardiovascular;  Laterality: N/A;     Current Outpatient Medications  Medication Sig Dispense Refill   acetaminophen (TYLENOL) 500 MG tablet Take  500 mg by mouth every 6 (six) hours as needed (pain).      alendronate (FOSAMAX) 70 MG tablet Take 70 mg by mouth every 7 (seven) days.  2   Ascorbic Acid (VITAMIN C) 1000 MG tablet Take 1,000 mg by mouth daily.     Cascara [place]-Nat Lax (BIOHM COLON CLEANSER) CAPS Take 2 capsules by mouth 2 (two) times daily.      Cholecalciferol (VITAMIN D-3) 125 MCG (5000 UT) TABS Take 1 capsule by mouth daily.     ibuprofen (ADVIL,MOTRIN) 200 MG tablet Take 200 mg by mouth every 6 (six) hours as needed (pain).      Omega 3-6-9 Fatty Acids (OMEGA-3-6-9 PO) Take 2 capsules by mouth every evening.      Probiotic Product (PROBIOTIC PO) Take 2 capsules by mouth every evening.      Zinc Sulfate (ZINC 15 PO) Take 1 capsule by mouth daily.     No current facility-administered medications for this visit.    Allergies:   Celecoxib and Penicillin g   Social History:  The patient  reports that she has never smoked. She has never used smokeless tobacco. She reports that she does not drink alcohol and does not use drugs.   Family History:  The patient's family history includes Cancer in her mother; Dementia in her mother; Other in her father.   ROS:  Please see the history of present illness.   Otherwise, review of systems is positive for none.   All  other systems are reviewed and negative.   PHYSICAL EXAM: VS:  BP (!) 140/82   Pulse 61   Ht '5\' 4"'$  (1.626 m)   Wt 151 lb (68.5 kg)   SpO2 95%   BMI 25.92 kg/m  , BMI Body mass index is 25.92 kg/m. GEN: Well nourished, well developed, in no acute distress  HEENT: normal  Neck: no JVD, carotid bruits, or masses Cardiac: RRR; no murmurs, rubs, or gallops,no edema  Respiratory:  clear to auscultation bilaterally, normal work of breathing GI: soft, nontender, nondistended, + BS MS: no deformity or atrophy  Skin: warm and dry, device site well healed Neuro:  Strength and sensation are intact Psych: euthymic mood, full affect  EKG:  EKG is ordered  today. Personal review of the ekg ordered shows AV paced  Personal review of the device interrogation today. Results in [place]: No results found for requested labs within last 365 days.    Lipid Panel  No results found for: "CHOL", "TRIG", "HDL", "CHOLHDL", "VLDL", "LDLCALC", "LDLDIRECT"   Wt Readings from Last 3 Encounters:  10/24/22 151 lb (68.5 kg)  11/01/21 149 lb (67.6 kg)  10/27/21 151 lb (68.5 kg)      Other studies Reviewed: Additional studies/ records that were reviewed today include: TTE 2018  Review of the above records today demonstrates:  - Left ventricle: The cavity size was normal. Wall thickness was    normal. Systolic function was normal. The estimated ejection    fraction was in the range of 60% to 65%. Wall motion was normal;    there were no regional wall motion abnormalities. The study is    not technically sufficient to allow evaluation of LV diastolic    function.  - Aortic valve: Mildly calcified annulus. Trileaflet; mildly    calcified leaflets.  - Mitral valve: There was trivial regurgitation.  - Right atrium: Central venous pressure (est): 15 mm Hg.  - Atrial septum: No defect or patent foramen ovale was identified.  - Tricuspid valve: There was mild regurgitation.  - Pulmonary arteries: PA peak pressure: 39 mm Hg (S).  - Pericardium, extracardiac: There was no pericardial effusion.    ASSESSMENT AND PLAN:  1.  Complete heart block: Status post Medtronic dual-chamber pacemaker implanted 2018.  Device functioning appropriately.  Has reached ERI.  Erika Reyes plan for generator change.  Risk and benefits have been discussed.  Risk of the bleeding, infection, tamponade, pneumothorax, lead dislodgment.  She understands the risks and is agreed to the procedure.  2.  Paroxysmal atrial fibrillation: CHA2DS2-VASc of 2.  Burden less than 0.1%.  All short episodes.  No changes.  3.  Shortness of breath: Unclear as to the cause of her shortness  of breath.  She does not have evidence of volume overload.  Erika Reyes plan for transthoracic echo.  Current medicines are reviewed at length with the patient today.   The patient does not have concerns regarding her medicines.  The following changes were made today: None  Labs/ tests ordered today include:  Orders Placed This Encounter  Procedures   CUP [place]   EKG 12-Lead   ECHOCARDIOGRAM COMPLETE     Disposition:   FU with Erika Reyes 3 months  Signed, Platon Arocho Meredith Leeds, MD  10/25/2022 8:13 AM     [facility] [address] [place] [address] [phone] (office) [phone] (fax)

## 2022-10-24 NOTE — Patient Instructions (Addendum)
Medication Instructions:  Your physician recommends that you continue on your current medications as directed. Please refer to the Current Medication list given to you today.  *If you need a refill on your cardiac medications before your next appointment, please call your pharmacy*   Lab Work: None ordered If you have labs (blood work) drawn today and your tests are completely normal, you will receive your results only by: [place] (if you have MyChart) OR A paper copy in the mail If you have any lab test that is abnormal or we need to change your treatment, we will call you to review the results.   Testing/Procedures: Your physician has requested that you have an echocardiogram. Echocardiography is a painless test that uses sound waves to create images of your heart. It provides your doctor with information about the size and shape of your heart and how well your heart's chambers and valves are working. This procedure takes approximately one hour. There are no restrictions for this procedure. Please do NOT wear cologne, perfume, aftershave, or lotions (deodorant is allowed). Please arrive 15 minutes prior to your appointment time.  You need a pacemaker battery change.  This will be scheduled for 11/24/2022.  Instructions will be sent via mychart   Follow-Up: At [facility], you and your health needs are our priority.  As part of our continuing mission to provide you with exceptional heart care, we have created designated Provider Care Teams.  These Care Teams include your primary Cardiologist (physician) and Advanced Practice Providers (APPs -  Physician Assistants and Nurse Practitioners) who all work together to provide you with the care you need, when you need it.  Your next appointment:   2 week(s) after your battery change  The format for your next appointment:   In Person  Provider:   Device clinic for a wound check     Thank you for choosing [facility]!!   Trinidad Curet, RN [phone]  Other Instructions

## 2022-10-25 ENCOUNTER — Encounter: Payer: Self-pay | Admitting: *Deleted

## 2022-10-25 ENCOUNTER — Telehealth: Payer: Self-pay | Admitting: Cardiology

## 2022-10-25 DIAGNOSIS — I442 Atrioventricular block, complete: Secondary | ICD-10-CM

## 2022-10-25 DIAGNOSIS — Z01812 Encounter for preprocedural laboratory examination: Secondary | ICD-10-CM

## 2022-10-25 NOTE — Telephone Encounter (Signed)
Rescheduled PPM gen change to 4/9. Pt aware going to have her arrive at 1:30 to [facility] for her procedure (aware this is 3 hours prior). She will stop by [facility] office 3/13 for blood work. Pre procedure scrub given to her yesterday in the office. Aware sending instructions via mychart today. Patient verbalized understanding and agreeable to plan.

## 2022-10-25 NOTE — Telephone Encounter (Signed)
Pt is scheduled for 11/29/22.Marland KitchenMarland Kitchen

## 2022-10-25 NOTE — Telephone Encounter (Signed)
Patient would like to reschedule her EP appointment please give her a call.

## 2022-10-25 NOTE — Telephone Encounter (Signed)
  Pt would like to r/s her procedure, she said she was given a date in  April but she wants to change it if she can

## 2022-11-02 ENCOUNTER — Ambulatory Visit: Payer: Medicare Other | Attending: Cardiology

## 2022-11-02 DIAGNOSIS — R0602 Shortness of breath: Secondary | ICD-10-CM | POA: Insufficient documentation

## 2022-11-02 LAB — ECHOCARDIOGRAM COMPLETE
Area-P 1/2: 3.27 cm2
S' Lateral: 2.6 cm

## 2022-11-03 NOTE — Addendum Note (Signed)
Addended by: Cheri Kearns A on: 11/03/2022 08:51 AM   Modules accepted: Level of Service

## 2022-11-03 NOTE — Progress Notes (Signed)
Remote pacemaker transmission.   

## 2022-11-07 ENCOUNTER — Ambulatory Visit (INDEPENDENT_AMBULATORY_CARE_PROVIDER_SITE_OTHER): Payer: Medicare Other

## 2022-11-07 DIAGNOSIS — I442 Atrioventricular block, complete: Secondary | ICD-10-CM

## 2022-11-08 LAB — CUP PACEART REMOTE DEVICE CHECK
Battery Remaining Longevity: 1 mo — CL
Battery Voltage: 2.61 V
Brady Statistic AP VP Percent: 34.83 %
Brady Statistic AP VS Percent: 0 %
Brady Statistic AS VP Percent: 65.17 %
Brady Statistic AS VS Percent: 0 %
Brady Statistic RA Percent Paced: 34.83 %
Brady Statistic RV Percent Paced: 100 %
Date Time Interrogation Session: 20240318003250
Implantable Lead Connection Status: 753985
Implantable Lead Connection Status: 753985
Implantable Lead Implant Date: 20180729
Implantable Lead Implant Date: 20180729
Implantable Lead Location: 753859
Implantable Lead Location: 753860
Implantable Lead Model: 3830
Implantable Lead Model: 5076
Implantable Pulse Generator Implant Date: 20180729
Lead Channel Impedance Value: 285 Ohm
Lead Channel Impedance Value: 323 Ohm
Lead Channel Impedance Value: 380 Ohm
Lead Channel Impedance Value: 437 Ohm
Lead Channel Pacing Threshold Amplitude: 0.375 V
Lead Channel Pacing Threshold Amplitude: 1.5 V
Lead Channel Pacing Threshold Pulse Width: 0.4 ms
Lead Channel Pacing Threshold Pulse Width: 0.4 ms
Lead Channel Sensing Intrinsic Amplitude: 0.875 mV
Lead Channel Sensing Intrinsic Amplitude: 0.875 mV
Lead Channel Sensing Intrinsic Amplitude: 2.625 mV
Lead Channel Sensing Intrinsic Amplitude: 2.625 mV
Lead Channel Setting Pacing Amplitude: 2 V
Lead Channel Setting Pacing Amplitude: 2.5 V
Lead Channel Setting Pacing Pulse Width: 1 ms
Lead Channel Setting Sensing Sensitivity: 0.9 mV
Zone Setting Status: 755011
Zone Setting Status: 755011

## 2022-11-28 NOTE — Pre-Procedure Instructions (Signed)
Instructed patient on the following items: Arrival time 1:30 Nothing to eat or drink after midnight No meds AM of procedure Responsible person to drive you home and stay with you for 24 hrs Wash with special soap night before and morning of procedure  

## 2022-11-29 ENCOUNTER — Encounter (HOSPITAL_COMMUNITY)
Admission: RE | Disposition: A | Discharge status: Home / Self Care | Payer: Self-pay | Source: Home / Self Care | Attending: Cardiology

## 2022-11-29 ENCOUNTER — Other Ambulatory Visit: Payer: Self-pay

## 2022-11-29 ENCOUNTER — Ambulatory Visit (HOSPITAL_COMMUNITY)
Admission: RE | Admit: 2022-11-29 | Discharge: 2022-11-29 | Disposition: A | Discharge status: Home / Self Care | Payer: Medicare Other | Attending: Cardiology | Admitting: Cardiology

## 2022-11-29 DIAGNOSIS — R001 Bradycardia, unspecified: Secondary | ICD-10-CM | POA: Diagnosis not present

## 2022-11-29 DIAGNOSIS — I480 Paroxysmal atrial fibrillation: Secondary | ICD-10-CM | POA: Insufficient documentation

## 2022-11-29 DIAGNOSIS — Z45010 Encounter for checking and testing of cardiac pacemaker pulse generator [battery]: Secondary | ICD-10-CM | POA: Diagnosis present

## 2022-11-29 DIAGNOSIS — Z950 Presence of cardiac pacemaker: Secondary | ICD-10-CM

## 2022-11-29 DIAGNOSIS — T829XXA Unspecified complication of cardiac and vascular prosthetic device, implant and graft, initial encounter: Secondary | ICD-10-CM

## 2022-11-29 DIAGNOSIS — I442 Atrioventricular block, complete: Secondary | ICD-10-CM | POA: Diagnosis not present

## 2022-11-29 HISTORY — PX: PPM GENERATOR CHANGEOUT: EP1233

## 2022-11-29 LAB — CBC
HCT: 41.9 % (ref 36.0–46.0)
Hemoglobin: 14.3 g/dL (ref 12.0–15.0)
MCH: 30.3 pg (ref 26.0–34.0)
MCHC: 34.1 g/dL (ref 30.0–36.0)
MCV: 88.8 fL (ref 80.0–100.0)
Platelets: 278 10*3/uL (ref 150–400)
RBC: 4.72 MIL/uL (ref 3.87–5.11)
RDW: 13.2 % (ref 11.5–15.5)
WBC: 9.2 10*3/uL (ref 4.0–10.5)
nRBC: 0 % (ref 0.0–0.2)

## 2022-11-29 LAB — BASIC METABOLIC PANEL
Anion gap: 10 (ref 5–15)
BUN: 16 mg/dL (ref 8–23)
CO2: 28 mmol/L (ref 22–32)
Calcium: 10.5 mg/dL — ABNORMAL HIGH (ref 8.9–10.3)
Chloride: 102 mmol/L (ref 98–111)
Creatinine, Ser: 0.67 mg/dL (ref 0.44–1.00)
GFR, Estimated: >60 mL/min (ref >60)
Glucose, Bld: 86 mg/dL (ref 70–99)
Potassium: 3.6 mmol/L (ref 3.5–5.1)
Sodium: 140 mmol/L (ref 135–145)

## 2022-11-29 SURGERY — PPM GENERATOR CHANGEOUT
Anesthesia: LOCAL

## 2022-11-29 MED ORDER — ONDANSETRON HCL 4 MG/2ML IJ SOLN: 4.0000 mg | Freq: Four times a day (QID) | INTRAMUSCULAR | Status: DC | PRN

## 2022-11-29 MED ORDER — ACETAMINOPHEN 325 MG PO TABS: 325.0000 mg | ORAL_TABLET | ORAL | Status: DC | PRN

## 2022-11-29 MED ORDER — SODIUM CHLORIDE 0.9 % IV SOLN
INTRAVENOUS | Status: AC
  Filled 2022-11-29: qty 2

## 2022-11-29 MED ORDER — LIDOCAINE HCL (PF) 1 % IJ SOLN
INTRAMUSCULAR | Status: DC | PRN
  Administered 2022-11-29: 50 mL

## 2022-11-29 MED ORDER — SODIUM CHLORIDE 0.9 % IV SOLN
80.0000 mg | INTRAVENOUS | Status: AC
  Administered 2022-11-29: 80 mg

## 2022-11-29 MED ORDER — VANCOMYCIN HCL IN DEXTROSE 1-5 GM/200ML-% IV SOLN
INTRAVENOUS | Status: AC
  Administered 2022-11-29: 1000 mg via INTRAVENOUS
  Filled 2022-11-29: qty 200

## 2022-11-29 MED ORDER — VANCOMYCIN HCL IN DEXTROSE 1-5 GM/200ML-% IV SOLN: 1000.0000 mg | INTRAVENOUS | Status: AC

## 2022-11-29 MED ORDER — CHLORHEXIDINE GLUCONATE 4 % EX LIQD
4.0000 | Freq: Once | CUTANEOUS | Status: DC
  Filled 2022-11-29: qty 60

## 2022-11-29 MED ORDER — LIDOCAINE HCL 1 % IJ SOLN
INTRAMUSCULAR | Status: AC
  Filled 2022-11-29: qty 60

## 2022-11-29 MED ORDER — SODIUM CHLORIDE 0.9 % IV SOLN: INTRAVENOUS | Status: DC

## 2022-11-29 SURGICAL SUPPLY — 7 items
CABLE SURGICAL S-101-97-12 (CABLE) ×1 IMPLANT
IPG PACE AZUR XT DR MRI W1DR01 (Pacemaker) IMPLANT
PACE AZURE XT DR MRI W1DR01 (Pacemaker) ×1 IMPLANT
PAD DEFIB RADIO PHYSIO CONN (PAD) ×1 IMPLANT
POUCH AIGIS-R ANTIBACT PPM (Mesh General) ×1 IMPLANT
POUCH AIGIS-R ANTIBACT PPM MED (Mesh General) IMPLANT
TRAY PACEMAKER INSERTION (PACKS) ×1 IMPLANT

## 2022-11-29 NOTE — Discharge Instructions (Signed)

## 2022-11-29 NOTE — H&P (Signed)
Electrophysiology Office Note   Date:  11/29/2022   ID:  Erika Reyes, DOB 08-27-1955, MRN [id number]  PCP:  Audie Pinto, FNP  Cardiologist:   Primary Electrophysiologist:  Isys Tietje Jorja Loa, MD    Chief Complaint: pacemaker   History of Present Illness: Erika Reyes is a 67 y.o. female who is being seen today for the evaluation of pacemaker at the request of No ref. provider found. Presenting today for electrophysiology evaluation.  She has a history significant for complete heart block.  She is status post Medtronic dual-chamber pacemaker implanted 2018.  She also has paroxysmal atrial fibrillation found on device interrogation.  Burden less than 0.1% and thus not anticoagulated.  Today, denies symptoms of palpitations, chest pain, orthopnea, PND, lower extremity edema, claudication, dizziness, presyncope, syncope, bleeding, or neurologic sequela. The patient is tolerating medications without difficulties.  He has been having episodes of shortness of breath.  She noted last summer that she was more short of breath that mainly occurs when she bends down or kneel down and stands back up.  She is also short of breath when she is exerting herself.  She likes to mow the lawn and uses a push mower and has noted more shortness of breath when she does this.  She is able to lie flat, though she does wear a CPAP.    Past Medical History:  Diagnosis Date   Cervical spondylosis    Complete heart block ([facility])    a. s/p MDT dual chamber (His Bundle) PPM - Dr Johney Frame   Concussion 05/2021   Meningioma ([facility]) 01/29/2014   Osteoporosis of lumbar spine    Pacemaker    Past Surgical History:  Procedure Laterality Date   PACEMAKER IMPLANT N/A 03/19/2017   Procedure: Pacemaker Implant;  Surgeon: Hillis Range, MD;  Location: [facility];  Service: Cardiovascular;  Laterality: N/A;     Current Facility-Administered Medications  Medication Dose Route Frequency Provider Last Rate Last Admin    0.9 %  sodium chloride infusion   Intravenous Continuous Tanisha Lutes Daphine Deutscher, MD       chlorhexidine (HIBICLENS) 4 % liquid 4 Application  4 Application Topical Once Byanca Kasper Daphine Deutscher, MD       gentamicin (GARAMYCIN) 80 mg in sodium chloride 0.9 % 500 mL irrigation  80 mg Irrigation On Call Omarius Grantham Daphine Deutscher, MD       vancomycin (VANCOCIN) IVPB 1000 mg/200 mL premix  1,000 mg Intravenous On Call Regan Lemming, MD        Allergies:   Celecoxib and Penicillin g   Social History:  The patient  reports that she has never smoked. She has never used smokeless tobacco. She reports that she does not drink alcohol and does not use drugs.   Family History:  The patient's family history includes Cancer in her mother; Dementia in her mother; Other in her father.   ROS:  Please see the history of present illness.   Otherwise, review of systems is positive for none.   All other systems are reviewed and negative.   PHYSICAL EXAM: VS:  There were no vitals taken for this visit. , BMI There is no height or weight on file to calculate BMI. GEN: Well nourished, well developed, in no acute distress  HEENT: normal  Neck: no JVD, carotid bruits, or masses Cardiac: RRR; no murmurs, rubs, or gallops,no edema  Respiratory:  clear to auscultation bilaterally, normal work of breathing GI: soft, nontender, nondistended, + BS  MS: no deformity or atrophy  Skin: warm and dry, device site well healed Neuro:  Strength and sensation are intact Psych: euthymic mood, full affect  EKG:  EKG is ordered today. Personal review of the ekg ordered shows AV paced  Personal review of the device interrogation today. Results in [place]    Recent Labs: No results found for requested labs within last 365 days.    Lipid Panel  No results found for: "CHOL", "TRIG", "HDL", "CHOLHDL", "VLDL", "LDLCALC", "LDLDIRECT"   Wt Readings from Last 3 Encounters:  10/24/22 68.5 kg  11/01/21 67.6 kg  10/27/21 68.5 kg       Other studies Reviewed: Additional studies/ records that were reviewed today include: TTE 2018  Review of the above records today demonstrates:  - Left ventricle: The cavity size was normal. Wall thickness was    normal. Systolic function was normal. The estimated ejection    fraction was in the range of 60% to 65%. Wall motion was normal;    there were no regional wall motion abnormalities. The study is    not technically sufficient to allow evaluation of LV diastolic    function.  - Aortic valve: Mildly calcified annulus. Trileaflet; mildly    calcified leaflets.  - Mitral valve: There was trivial regurgitation.  - Right atrium: Central venous pressure (est): 15 mm Hg.  - Atrial septum: No defect or patent foramen ovale was identified.  - Tricuspid valve: There was mild regurgitation.  - Pulmonary arteries: PA peak pressure: 39 mm Hg (S).  - Pericardium, extracardiac: There was no pericardial effusion.    ASSESSMENT AND PLAN:  1.  Complete heart block: Erika Reyes has presented today for surgery, with the diagnosis of pacemaker ERI.  The various methods of treatment have been discussed with the patient and family. After consideration of risks, benefits and other options for treatment, the patient has consented to  Procedure(s): Pacemaker generator change as a surgical intervention .  Risks include but not limited to bleeding, infection, pneumothorax, perforation, tamponade, vascular damage, renal failure, MI, stroke, death, and lead dislodgement . The patient's history has been reviewed, patient examined, no change in status, stable for surgery.  I have reviewed the patient's chart and labs.  Questions were answered to the patient's satisfaction.    Erika Renninger Elberta Fortis, MD 11/29/2022 1:45 PM

## 2022-11-30 ENCOUNTER — Encounter (HOSPITAL_COMMUNITY): Payer: Self-pay | Admitting: Cardiology

## 2022-12-05 ENCOUNTER — Encounter: Payer: Medicare Other | Admitting: Cardiology

## 2022-12-14 ENCOUNTER — Ambulatory Visit: Payer: Medicare Other | Attending: Cardiovascular Disease

## 2022-12-14 ENCOUNTER — Telehealth: Payer: Self-pay

## 2022-12-14 DIAGNOSIS — I442 Atrioventricular block, complete: Secondary | ICD-10-CM | POA: Insufficient documentation

## 2022-12-14 LAB — CUP PACEART INCLINIC DEVICE CHECK
Battery Remaining Longevity: 134 mo
Battery Voltage: 3.2 V
Brady Statistic AP VP Percent: 20.89 %
Brady Statistic AP VS Percent: 0 %
Brady Statistic AS VP Percent: 79.11 %
Brady Statistic AS VS Percent: 0 %
Brady Statistic RA Percent Paced: 20.87 %
Brady Statistic RV Percent Paced: 100 %
Date Time Interrogation Session: 20240424195820
Implantable Lead Connection Status: 753985
Implantable Lead Connection Status: 753985
Implantable Lead Implant Date: 20180729
Implantable Lead Implant Date: 20180729
Implantable Lead Location: 753859
Implantable Lead Location: 753860
Implantable Lead Model: 3830
Implantable Lead Model: 5076
Implantable Pulse Generator Implant Date: 20240409
Lead Channel Impedance Value: 285 Ohm
Lead Channel Impedance Value: 323 Ohm
Lead Channel Impedance Value: 380 Ohm
Lead Channel Impedance Value: 437 Ohm
Lead Channel Pacing Threshold Amplitude: 0.5 V
Lead Channel Pacing Threshold Amplitude: 1.5 V
Lead Channel Pacing Threshold Amplitude: 2.375 V
Lead Channel Pacing Threshold Pulse Width: 0.4 ms
Lead Channel Pacing Threshold Pulse Width: 0.4 ms
Lead Channel Pacing Threshold Pulse Width: 0.8 ms
Lead Channel Sensing Intrinsic Amplitude: 2 mV
Lead Channel Sensing Intrinsic Amplitude: 2.625 mV
Lead Channel Setting Pacing Amplitude: 1 V
Lead Channel Setting Pacing Amplitude: 3.5 V
Lead Channel Setting Pacing Pulse Width: 0.8 ms
Lead Channel Setting Sensing Sensitivity: 1.2 mV
Zone Setting Status: 755011
Zone Setting Status: 755011

## 2022-12-14 NOTE — Patient Instructions (Signed)
   After Your Pacemaker   Monitor your pacemaker site for redness, swelling, and drainage. Call the device clinic at [phone] if you experience these symptoms or fever/chills.  Your incision was closed with Steri-strips or staples:  You may shower 7 days after your procedure and wash your incision with soap and water. Avoid lotions, ointments, or perfumes over your incision until it is well-healed.  Your incision is still healing.  Cleanse at least twice daily with clean cloth/towel each use, applying dial soap and rinsing, pat dry thoroughly.  Return in 1 week for [place] to recheck area and ensure proper healing.  Call our office if any signs of infection develop.    You may use a hot tub or a pool after your wound check appointment if the incision is completely closed.  There are no other restrictions in arm movement after your wound check appointment.  You may drive, unless driving has been restricted by your healthcare providers.   Remote monitoring is used to monitor your pacemaker from home. This monitoring is scheduled every 91 days by our office. It allows [place] to keep an eye on the functioning of your device to ensure it is working properly. You will routinely see your Electrophysiologist annually (more often if necessary).

## 2022-12-14 NOTE — Telephone Encounter (Signed)
Patient lives in [place] and would like to have her 73 day recheck with Dr. Elberta Fortis rescheduled from [facility] to [place] location.    [place],  Can you please reschedule and reach out to patient to let her know?    Thanks.

## 2022-12-14 NOTE — Progress Notes (Signed)
Wound check appointment. Steri-strips removed. Wound without redness or edema. Incision edges approximated.  There is 1 stitch protruding through distal end of incision line.  Clipped stitch close to skin, cleansed area and wound instructions given after reviewing with Francis Dowse PA-C.  Patient to return in 1 week for recheck of area.  She will call if any concerns of infection begin to develop.  Otherwise normal wound healing.    Normal device function. Thresholds, sensing, and impedances consistent with implant measurements. Device programmed for chronic lead safety margins.  Histogram distribution appropriate for patient and level of activity. AT/AF burden is 0%, no high ventricular rates noted. Patient educated about wound care.  This is gen change only, no restrictions with arm movement.  ROV in 3 months with implanting physician.  PROGRAMMING CHANGE TODAY: 1.  RV threshold pulse width increased from .4ms to .8ms.  Improved RV threshold to 1.5V@.8ms from 2.5V@.4ms. Adaptive is on.

## 2022-12-16 NOTE — Addendum Note (Signed)
Addended by: Geralyn Flash D on: 12/16/2022 02:30 PM   Modules accepted: Level of Service

## 2022-12-16 NOTE — Progress Notes (Signed)
Remote pacemaker transmission.   

## 2022-12-23 ENCOUNTER — Ambulatory Visit: Payer: Medicare Other | Attending: Cardiovascular Disease

## 2022-12-23 DIAGNOSIS — Z950 Presence of cardiac pacemaker: Secondary | ICD-10-CM

## 2022-12-23 NOTE — Progress Notes (Signed)
Patient in today for 1 week recheck of wound.  Wound edges well approximated. Wound well healed no signs of redness, swelling or infection.  Patient cleared for normal shower routine and no longer needs to use daily Dial soap wash.  Patient has 91 day f/u appt with provider and device clinic number if any concerns develop in meantime.  We will continue with remote monitoring.

## 2023-02-27 ENCOUNTER — Telehealth: Payer: Self-pay | Admitting: Diagnostic Neuroimaging

## 2023-02-27 NOTE — Telephone Encounter (Signed)
Called the patient to advise that we would need to schedule an apt before we could order a MRI brain.   Pt has a pacemaker. Overall she is doing stable and just would get yearly MRI's when Dr Anne Hahn was present.  She has started having tingling on her head over the last couple weeks and that just concerned her. Since she is due for a yearly check in, she wanted to follow up and get MRI ordered.

## 2023-02-27 NOTE — Telephone Encounter (Signed)
Pt stated she would like her MRI done here instead of [facility].

## 2023-02-28 ENCOUNTER — Telehealth: Payer: Self-pay | Admitting: Adult Health

## 2023-02-28 ENCOUNTER — Ambulatory Visit: Payer: Medicare Other | Admitting: Family Medicine

## 2023-02-28 ENCOUNTER — Encounter: Payer: Self-pay | Admitting: Adult Health

## 2023-02-28 ENCOUNTER — Ambulatory Visit (INDEPENDENT_AMBULATORY_CARE_PROVIDER_SITE_OTHER): Payer: Medicare Other | Admitting: Adult Health

## 2023-02-28 VITALS — BP 122/77 | HR 69 | Ht 64.0 in | Wt 160.0 lb

## 2023-02-28 DIAGNOSIS — R413 Other amnesia: Secondary | ICD-10-CM

## 2023-02-28 DIAGNOSIS — D329 Benign neoplasm of meninges, unspecified: Secondary | ICD-10-CM

## 2023-02-28 NOTE — Patient Instructions (Signed)
Your Plan:  MRI ordered If your symptoms worsen or you develop new symptoms please let us know.    Thank you for coming to see [place] at [facility]. I hope we have been able to provide you high quality care today.  You may receive a patient satisfaction survey over the next few weeks. We would appreciate your feedback and comments so that we may continue to improve ourselves and the health of our patients.

## 2023-02-28 NOTE — Telephone Encounter (Signed)
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 [phone]
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
## 2023-03-01 ENCOUNTER — Ambulatory Visit (INDEPENDENT_AMBULATORY_CARE_PROVIDER_SITE_OTHER): Payer: Medicare Other

## 2023-03-01 DIAGNOSIS — I442 Atrioventricular block, complete: Secondary | ICD-10-CM | POA: Diagnosis not present

## 2023-03-01 LAB — CUP PACEART REMOTE DEVICE CHECK
Battery Remaining Longevity: 97 mo
Battery Voltage: 3.15 V
Brady Statistic AP VP Percent: 19.09 %
Brady Statistic AP VS Percent: 0 %
Brady Statistic AS VP Percent: 80.9 %
Brady Statistic AS VS Percent: 0.01 %
Brady Statistic RA Percent Paced: 19.09 %
Brady Statistic RV Percent Paced: 99.99 %
Date Time Interrogation Session: 20240709193537
Implantable Lead Connection Status: 753985
Implantable Lead Connection Status: 753985
Implantable Lead Implant Date: 20180729
Implantable Lead Implant Date: 20180729
Implantable Lead Location: 753859
Implantable Lead Location: 753860
Implantable Lead Model: 3830
Implantable Lead Model: 5076
Implantable Pulse Generator Implant Date: 20240409
Lead Channel Impedance Value: 285 Ohm
Lead Channel Impedance Value: 304 Ohm
Lead Channel Impedance Value: 361 Ohm
Lead Channel Impedance Value: 418 Ohm
Lead Channel Pacing Threshold Amplitude: 0.5 V
Lead Channel Pacing Threshold Amplitude: 2.5 V
Lead Channel Pacing Threshold Pulse Width: 0.4 ms
Lead Channel Pacing Threshold Pulse Width: 0.4 ms
Lead Channel Sensing Intrinsic Amplitude: 1.25 mV
Lead Channel Sensing Intrinsic Amplitude: 1.25 mV
Lead Channel Setting Pacing Amplitude: 1 V
Lead Channel Setting Pacing Amplitude: 3.75 V
Lead Channel Setting Pacing Pulse Width: 0.4 ms
Lead Channel Setting Sensing Sensitivity: 1.2 mV
Zone Setting Status: 755011
Zone Setting Status: 755011

## 2023-03-06 ENCOUNTER — Ambulatory Visit: Payer: Medicare Other | Attending: Cardiology | Admitting: Cardiology

## 2023-03-06 ENCOUNTER — Encounter: Payer: Self-pay | Admitting: Cardiology

## 2023-03-06 VITALS — BP 140/76 | HR 62 | Ht 64.0 in | Wt 161.0 lb

## 2023-03-06 DIAGNOSIS — Z950 Presence of cardiac pacemaker: Secondary | ICD-10-CM | POA: Diagnosis not present

## 2023-03-06 DIAGNOSIS — I442 Atrioventricular block, complete: Secondary | ICD-10-CM | POA: Insufficient documentation

## 2023-03-06 DIAGNOSIS — I480 Paroxysmal atrial fibrillation: Secondary | ICD-10-CM | POA: Diagnosis not present

## 2023-03-06 LAB — CUP PACEART INCLINIC DEVICE CHECK
Brady Statistic RA Percent Paced: 19.4 %
Brady Statistic RV Percent Paced: 100 %
Date Time Interrogation Session: 20240715093458
Implantable Lead Connection Status: 753985
Implantable Lead Connection Status: 753985
Implantable Lead Implant Date: 20180729
Implantable Lead Implant Date: 20180729
Implantable Lead Location: 753859
Implantable Lead Location: 753860
Implantable Lead Model: 3830
Implantable Lead Model: 5076
Implantable Pulse Generator Implant Date: 20240409
Lead Channel Impedance Value: 399 Ohm
Lead Channel Impedance Value: 418 Ohm
Lead Channel Pacing Threshold Amplitude: 0.5 V
Lead Channel Pacing Threshold Amplitude: 1.5 V
Lead Channel Pacing Threshold Pulse Width: 0.4 ms
Lead Channel Pacing Threshold Pulse Width: 0.8 ms
Lead Channel Sensing Intrinsic Amplitude: 2.5 mV

## 2023-03-06 NOTE — Progress Notes (Signed)
  Electrophysiology Office Note:   Date:  03/06/2023  ID:  Erika Reyes, DOB Oct 31, 1955, MRN [id number]  Primary Cardiologist: None Electrophysiologist: Llewellyn Choplin Jorja Loa, MD      History of Present Illness:   Erika Reyes is a 67 y.o. female with h/o complete heart block seen today for routine electrophysiology followup.  Since last being seen in our clinic the patient reports doing all.  She has no chest pain or shortness of breath.  She is able to all of her daily activities without restriction.  She has had no issues since her generator change..  she denies chest pain, palpitations, dyspnea, PND, orthopnea, nausea, vomiting, dizziness, syncope, edema, weight gain, or early satiety.   Review of systems complete and found to be negative unless listed in HPI.      EP Information / Studies Reviewed:    EKG is ordered today. Personal review as below.  EKG Interpretation Date/Time:  Monday March 06 2023 09:20:22 EDT Ventricular Rate:  72 PR Interval:  210 QRS Duration:  130 QT Interval:  390 QTC Calculation: 427 R Axis:   59  Text Interpretation: AV dual-paced rhythm with prolonged AV conduction When compared with ECG of 20-Mar-2017 06:44, Vent. rate has increased BY   6 BPM Confirmed by Cailey Trigueros ([id number]) on 03/06/2023 9:38:25 AM   PPM Interrogation-  reviewed in detail today,  See PACEART report.  Device History: Medtronic Dual Chamber PPM implanted 2018 for CHB  Risk Assessment/Calculations:      Physical Exam:   VS:  BP (!) 140/76   Pulse 62   Ht 5\' 4"  (1.626 m)   Wt 161 lb (73 kg)   SpO2 98%   BMI 27.64 kg/m    Wt Readings from Last 3 Encounters:  03/06/23 161 lb (73 kg)  02/28/23 160 lb (72.6 kg)  11/29/22 150 lb (68 kg)     GEN: Well nourished, well developed in no acute distress NECK: No JVD; No carotid bruits CARDIAC: Regular rate and rhythm, no murmurs, rubs, gallops RESPIRATORY:  Clear to auscultation without rales, wheezing or rhonchi  ABDOMEN:  Soft, non-tender, non-distended EXTREMITIES:  No edema; No deformity   ASSESSMENT AND PLAN:    1.  CHB s/p Medtronic PPM  Normal PPM function See Pace Art report No changes today  2.  Paroxysmal atrial fibrillation: Burden less than 0.1%.  All short episodes.  No changes.    Disposition:   Follow up with Dr. Elberta Fortis in 12 months  Signed, Ronn Smolinsky Jorja Loa, MD

## 2023-03-06 NOTE — Patient Instructions (Signed)
Medication Instructions:  Your physician recommends that you continue on your current medications as directed. Please refer to the Current Medication list given to you today.  *If you need a refill on your cardiac medications before your next appointment, please call your pharmacy*   Lab Work: None ordered   Testing/Procedures: None ordered   Follow-Up: At [facility], you and your health needs are our priority.  As part of our continuing mission to provide you with exceptional heart care, we have created designated Provider Care Teams.  These Care Teams include your primary Cardiologist (physician) and Advanced Practice Providers (APPs -  Physician Assistants and Nurse Practitioners) who all work together to provide you with the care you need, when you need it.  Remote monitoring is used to monitor your Pacemaker or ICD from home. This monitoring reduces the number of office visits required to check your device to one time per year. It allows [place] to keep an eye on the functioning of your device to ensure it is working properly. You are scheduled for a device check from home on 05/31/2023. You may send your transmission at any time that day. If you have a wireless device, the transmission will be sent automatically. After your physician reviews your transmission, you will receive a postcard with your next transmission date.  Your next appointment:   1 year(s)  The format for your next appointment:   In Person  Provider:   Loman Brooklyn, MD    Thank you for choosing [facility]!!   Dory Horn, RN [phone]

## 2023-03-22 NOTE — Progress Notes (Signed)
Remote pacemaker transmission.   

## 2023-05-09 ENCOUNTER — Ambulatory Visit (HOSPITAL_COMMUNITY)
Admission: RE | Admit: 2023-05-09 | Discharge: 2023-05-09 | Disposition: A | Discharge status: Home / Self Care | Payer: Medicare Other | Source: Ambulatory Visit | Attending: Adult Health | Admitting: Adult Health

## 2023-05-09 DIAGNOSIS — D329 Benign neoplasm of meninges, unspecified: Secondary | ICD-10-CM | POA: Insufficient documentation

## 2023-05-09 MED ORDER — GADOBUTROL 1 MMOL/ML IV SOLN: 10.0000 mL | Freq: Once | INTRAVENOUS | Status: DC | PRN

## 2023-05-09 MED ORDER — GADOBUTROL 1 MMOL/ML IV SOLN
7.0000 mL | Freq: Once | INTRAVENOUS | Status: AC | PRN
  Administered 2023-05-09: 7 mL via INTRAVENOUS

## 2023-05-31 ENCOUNTER — Ambulatory Visit (INDEPENDENT_AMBULATORY_CARE_PROVIDER_SITE_OTHER): Payer: Medicare Other

## 2023-05-31 DIAGNOSIS — I442 Atrioventricular block, complete: Secondary | ICD-10-CM | POA: Diagnosis not present

## 2023-05-31 LAB — CUP PACEART REMOTE DEVICE CHECK
Battery Remaining Longevity: 86 mo
Battery Voltage: 3.06 V
Brady Statistic AP VP Percent: 21.85 %
Brady Statistic AP VS Percent: 0 %
Brady Statistic AS VP Percent: 78.15 %
Brady Statistic AS VS Percent: 0 %
Brady Statistic RA Percent Paced: 21.84 %
Brady Statistic RV Percent Paced: 100 %
Date Time Interrogation Session: 20241008232214
Implantable Lead Connection Status: 753985
Implantable Lead Connection Status: 753985
Implantable Lead Implant Date: 20180729
Implantable Lead Implant Date: 20180729
Implantable Lead Location: 753859
Implantable Lead Location: 753860
Implantable Lead Model: 3830
Implantable Lead Model: 5076
Implantable Pulse Generator Implant Date: 20240409
Lead Channel Impedance Value: 285 Ohm
Lead Channel Impedance Value: 304 Ohm
Lead Channel Impedance Value: 361 Ohm
Lead Channel Impedance Value: 399 Ohm
Lead Channel Pacing Threshold Amplitude: 0.625 V
Lead Channel Pacing Threshold Amplitude: 2.25 V
Lead Channel Pacing Threshold Pulse Width: 0.4 ms
Lead Channel Pacing Threshold Pulse Width: 0.4 ms
Lead Channel Sensing Intrinsic Amplitude: 2.125 mV
Lead Channel Sensing Intrinsic Amplitude: 2.125 mV
Lead Channel Setting Pacing Amplitude: 1 V
Lead Channel Setting Pacing Amplitude: 3 V
Lead Channel Setting Pacing Pulse Width: 0.8 ms
Lead Channel Setting Sensing Sensitivity: 1.2 mV
Zone Setting Status: 755011
Zone Setting Status: 755011

## 2023-06-01 ENCOUNTER — Telehealth: Payer: Self-pay | Admitting: *Deleted

## 2023-06-01 NOTE — Telephone Encounter (Signed)
-----   Message from [facility] sent at 05/31/2023 10:09 AM EDT ----- Left frontal meningioma only mildly increased.  We will continue to monitor.

## 2023-06-01 NOTE — Telephone Encounter (Signed)
Called pt left VM to call back and discuss results

## 2023-06-06 NOTE — Telephone Encounter (Signed)
Spoke with pt and advised MRI shows mildly increased L frontal meningioma. We will continue to monitor. Pt's next appt is in July 2025. She will call us if she has any changes or notes any symptoms before then. She also apologized for not calling us back yet as she was out of town.

## 2023-06-19 NOTE — Progress Notes (Signed)
Remote pacemaker transmission.   

## 2023-08-30 ENCOUNTER — Ambulatory Visit (INDEPENDENT_AMBULATORY_CARE_PROVIDER_SITE_OTHER): Payer: Medicare Other

## 2023-08-30 DIAGNOSIS — I442 Atrioventricular block, complete: Secondary | ICD-10-CM

## 2023-08-30 LAB — CUP PACEART REMOTE DEVICE CHECK
Battery Remaining Longevity: 84 mo
Battery Voltage: 3.02 V
Brady Statistic AP VP Percent: 13.77 %
Brady Statistic AP VS Percent: 0 %
Brady Statistic AS VP Percent: 86.23 %
Brady Statistic AS VS Percent: 0 %
Brady Statistic RA Percent Paced: 13.75 %
Brady Statistic RV Percent Paced: 100 %
Date Time Interrogation Session: 20250108002347
Implantable Lead Connection Status: 753985
Implantable Lead Connection Status: 753985
Implantable Lead Implant Date: 20180729
Implantable Lead Implant Date: 20180729
Implantable Lead Location: 753859
Implantable Lead Location: 753860
Implantable Lead Model: 3830
Implantable Lead Model: 5076
Implantable Pulse Generator Implant Date: 20240409
Lead Channel Impedance Value: 304 Ohm
Lead Channel Impedance Value: 361 Ohm
Lead Channel Impedance Value: 399 Ohm
Lead Channel Impedance Value: 418 Ohm
Lead Channel Pacing Threshold Amplitude: 0.5 V
Lead Channel Pacing Threshold Amplitude: 2 V
Lead Channel Pacing Threshold Pulse Width: 0.4 ms
Lead Channel Pacing Threshold Pulse Width: 0.4 ms
Lead Channel Sensing Intrinsic Amplitude: 2.375 mV
Lead Channel Sensing Intrinsic Amplitude: 2.375 mV
Lead Channel Setting Pacing Amplitude: 1 V
Lead Channel Setting Pacing Amplitude: 3 V
Lead Channel Setting Pacing Pulse Width: 0.8 ms
Lead Channel Setting Sensing Sensitivity: 1.2 mV
Zone Setting Status: 755011
Zone Setting Status: 755011

## 2023-10-11 NOTE — Progress Notes (Signed)
 Remote pacemaker transmission.

## 2023-11-29 ENCOUNTER — Ambulatory Visit (INDEPENDENT_AMBULATORY_CARE_PROVIDER_SITE_OTHER): Payer: BLUE CROSS/BLUE SHIELD

## 2023-11-29 DIAGNOSIS — I442 Atrioventricular block, complete: Secondary | ICD-10-CM | POA: Diagnosis not present

## 2023-11-29 LAB — CUP PACEART REMOTE DEVICE CHECK
Battery Remaining Longevity: 80 mo
Battery Voltage: 3.01 V
Brady Statistic AP VP Percent: 15.35 %
Brady Statistic AP VS Percent: 0 %
Brady Statistic AS VP Percent: 84.65 %
Brady Statistic AS VS Percent: 0 %
Brady Statistic RA Percent Paced: 15.34 %
Brady Statistic RV Percent Paced: 100 %
Date Time Interrogation Session: 20250409052821
Implantable Lead Connection Status: 753985
Implantable Lead Connection Status: 753985
Implantable Lead Implant Date: 20180729
Implantable Lead Implant Date: 20180729
Implantable Lead Location: 753859
Implantable Lead Location: 753860
Implantable Lead Model: 3830
Implantable Lead Model: 5076
Implantable Pulse Generator Implant Date: 20240409
Lead Channel Impedance Value: 285 Ohm
Lead Channel Impedance Value: 342 Ohm
Lead Channel Impedance Value: 380 Ohm
Lead Channel Impedance Value: 399 Ohm
Lead Channel Pacing Threshold Amplitude: 0.5 V
Lead Channel Pacing Threshold Amplitude: 1.875 V
Lead Channel Pacing Threshold Pulse Width: 0.4 ms
Lead Channel Pacing Threshold Pulse Width: 0.4 ms
Lead Channel Sensing Intrinsic Amplitude: 2.125 mV
Lead Channel Sensing Intrinsic Amplitude: 2.125 mV
Lead Channel Setting Pacing Amplitude: 1 V
Lead Channel Setting Pacing Amplitude: 3 V
Lead Channel Setting Pacing Pulse Width: 0.8 ms
Lead Channel Setting Sensing Sensitivity: 1.2 mV
Zone Setting Status: 755011
Zone Setting Status: 755011

## 2024-01-12 NOTE — Progress Notes (Signed)
 Remote pacemaker transmission.

## 2024-02-28 ENCOUNTER — Ambulatory Visit (INDEPENDENT_AMBULATORY_CARE_PROVIDER_SITE_OTHER): Payer: BLUE CROSS/BLUE SHIELD

## 2024-02-28 ENCOUNTER — Ambulatory Visit: Payer: Medicare Other | Admitting: Adult Health

## 2024-02-28 DIAGNOSIS — I442 Atrioventricular block, complete: Secondary | ICD-10-CM

## 2024-02-29 LAB — CUP PACEART REMOTE DEVICE CHECK
Battery Remaining Longevity: 77 mo
Battery Voltage: 3 V
Brady Statistic AP VP Percent: 20.77 %
Brady Statistic AP VS Percent: 0 %
Brady Statistic AS VP Percent: 79.23 %
Brady Statistic AS VS Percent: 0 %
Brady Statistic RA Percent Paced: 20.76 %
Brady Statistic RV Percent Paced: 100 %
Date Time Interrogation Session: 20250708230603
Implantable Lead Connection Status: 753985
Implantable Lead Connection Status: 753985
Implantable Lead Implant Date: 20180729
Implantable Lead Implant Date: 20180729
Implantable Lead Location: 753859
Implantable Lead Location: 753860
Implantable Lead Model: 3830
Implantable Lead Model: 5076
Implantable Pulse Generator Implant Date: 20240409
Lead Channel Impedance Value: 285 Ohm
Lead Channel Impedance Value: 323 Ohm
Lead Channel Impedance Value: 380 Ohm
Lead Channel Impedance Value: 399 Ohm
Lead Channel Pacing Threshold Amplitude: 0.5 V
Lead Channel Pacing Threshold Amplitude: 2.125 V
Lead Channel Pacing Threshold Pulse Width: 0.4 ms
Lead Channel Pacing Threshold Pulse Width: 0.4 ms
Lead Channel Sensing Intrinsic Amplitude: 1.75 mV
Lead Channel Sensing Intrinsic Amplitude: 1.75 mV
Lead Channel Setting Pacing Amplitude: 1 V
Lead Channel Setting Pacing Amplitude: 3 V
Lead Channel Setting Pacing Pulse Width: 0.8 ms
Lead Channel Setting Sensing Sensitivity: 1.2 mV
Zone Setting Status: 755011
Zone Setting Status: 755011

## 2024-03-06 ENCOUNTER — Ambulatory Visit: Payer: Self-pay | Admitting: Cardiology
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
## 2024-05-01 ENCOUNTER — Ambulatory Visit (INDEPENDENT_AMBULATORY_CARE_PROVIDER_SITE_OTHER): Admitting: Adult Health

## 2024-05-01 ENCOUNTER — Encounter: Payer: Self-pay | Admitting: Adult Health

## 2024-05-01 VITALS — BP 141/70 | HR 74 | Ht 64.0 in | Wt 160.0 lb

## 2024-05-01 DIAGNOSIS — D329 Benign neoplasm of meninges, unspecified: Secondary | ICD-10-CM

## 2024-05-01 NOTE — Patient Instructions (Signed)
 Your Plan:  Will consider repeating MRI at the next visit Please call if you have new or worsening symptoms     Thank you for coming to see us  at [facility]. I hope we have been able to provide you high quality care today.  You may receive a patient satisfaction survey over the next few weeks. We would appreciate your feedback and comments so that we may continue to improve ourselves and the health of our patients.

## 2024-05-11 NOTE — Progress Notes (Unsigned)
  Electrophysiology Office Note:   Date:  05/13/2024  ID:  Erika Reyes, DOB 1955-09-21, MRN [id number]  Primary Cardiologist: None Primary Heart Failure: None Electrophysiologist: Levenia Skalicky [name], MD      History of Present Illness:   Erika Reyes is a 68 y.o. female with h/o heart block seen today for routine electrophysiology followup.   Since last being seen in our clinic the patient reports doing well.  She has not noted any issues from her pacemaker.  She has been able to do all of her daily activities without issues.  She is going to try and lose some weight.  She has bike trainers at home.  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  She is taking care of her 8-month grandchild 2 days a week.  she denies chest pain, palpitations, dyspnea, PND, orthopnea, nausea, vomiting, dizziness, syncope, edema, weight gain, or early satiety.   Review of systems complete and found to be negative unless listed in HPI.      EP Information / Studies Reviewed:    EKG is ordered today. Personal review as below.  EKG Interpretation Date/Time:  Monday May 13 2024 08:40:32 EDT Ventricular Rate:  77 PR Interval:  188 QRS Duration:  158 QT Interval:  410 QTC Calculation: 463 R Axis:   47  Text Interpretation: AV dual-paced rhythm Abnormal ECG When compared with ECG of 06-Mar-2023 09:20, Vent. rate has increased BY   5 BPM Confirmed by Leven Hoel ([id number]) on 05/13/2024 8:42:39 AM   PPM Interrogation-  reviewed in detail today,  See PACEART report.  Device History: Medtronic Dual Chamber PPM implanted 2018 for CHB  Risk Assessment/Calculations:          Physical Exam:   VS:  BP 138/76   Pulse 77   Ht 5' 4 (1.626 m)   Wt 159 lb 12.8 oz (72.5 kg)   SpO2 99%   BMI 27.43 kg/m    Wt Readings from Last 3 Encounters:  05/13/24 159 lb 12.8 oz (72.5 kg)  05/01/24 160 lb (72.6 kg)  03/06/23 161 lb (73 kg)     GEN: Well nourished, well  developed in no acute distress NECK: No JVD; No carotid bruits CARDIAC: Regular rate and rhythm, no murmurs, rubs, gallops RESPIRATORY:  Clear to auscultation without rales, wheezing or rhonchi  ABDOMEN: Soft, non-tender, non-distended EXTREMITIES:  No edema; No deformity   ASSESSMENT AND PLAN:    CHB s/p Medtronic PPM  Normal PPM function See Pace Art report No changes today  2.  Paroxysmal atrial fibrillation: Burden less than 0.1%.  All short episodes.  No changes.  Disposition:   Follow up with Dr. Norton 2 years   Signed, Amahri Dengel [name], MD

## 2024-05-13 ENCOUNTER — Ambulatory Visit: Attending: Cardiology | Admitting: Cardiology

## 2024-05-13 ENCOUNTER — Encounter: Payer: Self-pay | Admitting: Cardiology

## 2024-05-13 VITALS — BP 138/76 | HR 77 | Ht 64.0 in | Wt 159.8 lb

## 2024-05-13 DIAGNOSIS — I442 Atrioventricular block, complete: Secondary | ICD-10-CM | POA: Diagnosis present

## 2024-05-13 LAB — CUP PACEART INCLINIC DEVICE CHECK
Date Time Interrogation Session: 20250922085225
Implantable Lead Connection Status: 753985
Implantable Lead Connection Status: 753985
Implantable Lead Implant Date: 20180729
Implantable Lead Implant Date: 20180729
Implantable Lead Location: 753859
Implantable Lead Location: 753860
Implantable Lead Model: 3830
Implantable Lead Model: 5076
Implantable Pulse Generator Implant Date: 20240409

## 2024-05-29 ENCOUNTER — Ambulatory Visit (INDEPENDENT_AMBULATORY_CARE_PROVIDER_SITE_OTHER): Payer: Self-pay

## 2024-05-29 DIAGNOSIS — I442 Atrioventricular block, complete: Secondary | ICD-10-CM

## 2024-05-30 LAB — CUP PACEART REMOTE DEVICE CHECK
Battery Remaining Longevity: 75 mo
Battery Voltage: 3 V
Brady Statistic AP VP Percent: 20.02 %
Brady Statistic AP VS Percent: 0 %
Brady Statistic AS VP Percent: 79.98 %
Brady Statistic AS VS Percent: 0 %
Brady Statistic RA Percent Paced: 20.01 %
Brady Statistic RV Percent Paced: 100 %
Date Time Interrogation Session: 20251007220234
Implantable Lead Connection Status: 753985
Implantable Lead Connection Status: 753985
Implantable Lead Implant Date: 20180729
Implantable Lead Implant Date: 20180729
Implantable Lead Location: 753859
Implantable Lead Location: 753860
Implantable Lead Model: 3830
Implantable Lead Model: 5076
Implantable Pulse Generator Implant Date: 20240409
Lead Channel Impedance Value: 304 Ohm
Lead Channel Impedance Value: 342 Ohm
Lead Channel Impedance Value: 380 Ohm
Lead Channel Impedance Value: 418 Ohm
Lead Channel Pacing Threshold Amplitude: 0.625 V
Lead Channel Pacing Threshold Amplitude: 2.125 V
Lead Channel Pacing Threshold Pulse Width: 0.4 ms
Lead Channel Pacing Threshold Pulse Width: 0.4 ms
Lead Channel Sensing Intrinsic Amplitude: 1.375 mV
Lead Channel Sensing Intrinsic Amplitude: 1.375 mV
Lead Channel Setting Pacing Amplitude: 1 V
Lead Channel Setting Pacing Amplitude: 3 V
Lead Channel Setting Pacing Pulse Width: 0.8 ms
Lead Channel Setting Sensing Sensitivity: 1.2 mV
Zone Setting Status: 755011
Zone Setting Status: 755011

## 2024-05-31 ENCOUNTER — Ambulatory Visit: Payer: Self-pay | Admitting: Cardiology

## 2024-05-31 NOTE — Progress Notes (Signed)
 Remote PPM Transmission

## 2024-06-03 NOTE — Progress Notes (Signed)
 Remote PPM Transmission

## 2024-08-28 ENCOUNTER — Ambulatory Visit: Payer: Self-pay

## 2024-08-28 DIAGNOSIS — I442 Atrioventricular block, complete: Secondary | ICD-10-CM

## 2024-08-29 ENCOUNTER — Ambulatory Visit: Payer: Self-pay | Admitting: Cardiology

## 2024-08-29 LAB — CUP PACEART REMOTE DEVICE CHECK
Battery Remaining Longevity: 72 mo
Battery Voltage: 3 V
Brady Statistic AP VP Percent: 23.52 %
Brady Statistic AP VS Percent: 0 %
Brady Statistic AS VP Percent: 76.48 %
Brady Statistic AS VS Percent: 0 %
Brady Statistic RA Percent Paced: 23.5 %
Brady Statistic RV Percent Paced: 100 %
Date Time Interrogation Session: 20260107204054
Implantable Lead Connection Status: 753985
Implantable Lead Connection Status: 753985
Implantable Lead Implant Date: 20180729
Implantable Lead Implant Date: 20180729
Implantable Lead Location: 753859
Implantable Lead Location: 753860
Implantable Lead Model: 3830
Implantable Lead Model: 5076
Implantable Pulse Generator Implant Date: 20240409
Lead Channel Impedance Value: 285 Ohm
Lead Channel Impedance Value: 323 Ohm
Lead Channel Impedance Value: 380 Ohm
Lead Channel Impedance Value: 418 Ohm
Lead Channel Pacing Threshold Amplitude: 0.5 V
Lead Channel Pacing Threshold Amplitude: 2.125 V
Lead Channel Pacing Threshold Pulse Width: 0.4 ms
Lead Channel Pacing Threshold Pulse Width: 0.4 ms
Lead Channel Sensing Intrinsic Amplitude: 2.75 mV
Lead Channel Sensing Intrinsic Amplitude: 2.75 mV
Lead Channel Setting Pacing Amplitude: 1 V
Lead Channel Setting Pacing Amplitude: 3 V
Lead Channel Setting Pacing Pulse Width: 0.8 ms
Lead Channel Setting Sensing Sensitivity: 1.2 mV
Zone Setting Status: 755011
Zone Setting Status: 755011

## 2024-09-02 NOTE — Progress Notes (Signed)
 Remote PPM Transmission

## 2025-05-08 ENCOUNTER — Telehealth: Admitting: Adult Health
